# Patient Record
Sex: Female | Born: 1950
Health system: Southern US, Community
[De-identification: ages and names within clinical notes are randomized; demographics above are authoritative.]

## PROBLEM LIST (undated history)

## (undated) DIAGNOSIS — K219 Gastro-esophageal reflux disease without esophagitis: Secondary | ICD-10-CM

## (undated) DIAGNOSIS — F32A Depression, unspecified: Secondary | ICD-10-CM

## (undated) DIAGNOSIS — M797 Fibromyalgia: Secondary | ICD-10-CM

## (undated) DIAGNOSIS — F329 Major depressive disorder, single episode, unspecified: Secondary | ICD-10-CM

## (undated) DIAGNOSIS — S2239XA Fracture of one rib, unspecified side, initial encounter for closed fracture: Secondary | ICD-10-CM

## (undated) DIAGNOSIS — Z5189 Encounter for other specified aftercare: Secondary | ICD-10-CM

## (undated) DIAGNOSIS — S2249XA Multiple fractures of ribs, unspecified side, initial encounter for closed fracture: Secondary | ICD-10-CM

## (undated) DIAGNOSIS — T7840XA Allergy, unspecified, initial encounter: Secondary | ICD-10-CM

## (undated) HISTORY — DX: Multiple fractures of ribs, unspecified side, initial encounter for closed fracture: S22.49XA

## (undated) HISTORY — DX: Fracture of one rib, unspecified side, initial encounter for closed fracture: S22.39XA

## (undated) HISTORY — PX: WRIST SURGERY: SHX841

## (undated) HISTORY — DX: Allergy, unspecified, initial encounter: T78.40XA

## (undated) HISTORY — DX: Gastro-esophageal reflux disease without esophagitis: K21.9

## (undated) HISTORY — DX: Fibromyalgia: M79.7

## (undated) HISTORY — DX: Major depressive disorder, single episode, unspecified: F32.9

## (undated) HISTORY — PX: COLONOSCOPY: SHX174

## (undated) HISTORY — DX: Depression, unspecified: F32.A

## (undated) HISTORY — PX: ABDOMINAL HYSTERECTOMY: SHX81

## (undated) HISTORY — DX: Encounter for other specified aftercare: Z51.89

---

## 2005-02-20 LAB — HM COLONOSCOPY

## 2005-05-31 ENCOUNTER — Encounter: Payer: Self-pay | Admitting: Gastroenterology

## 2005-06-05 ENCOUNTER — Encounter: Payer: Self-pay | Admitting: Gastroenterology

## 2006-02-20 LAB — CONVERTED CEMR LAB

## 2006-02-20 LAB — HM PAP SMEAR

## 2008-05-04 ENCOUNTER — Ambulatory Visit: Payer: Self-pay | Admitting: Family Medicine

## 2008-05-04 DIAGNOSIS — R5383 Other fatigue: Secondary | ICD-10-CM

## 2008-05-04 DIAGNOSIS — J309 Allergic rhinitis, unspecified: Secondary | ICD-10-CM | POA: Insufficient documentation

## 2008-05-04 DIAGNOSIS — R5381 Other malaise: Secondary | ICD-10-CM | POA: Insufficient documentation

## 2008-05-07 ENCOUNTER — Ambulatory Visit: Payer: Self-pay | Admitting: Family Medicine

## 2008-05-11 ENCOUNTER — Ambulatory Visit: Payer: Self-pay | Admitting: Family Medicine

## 2008-05-11 LAB — CONVERTED CEMR LAB
ALT: 22 units/L (ref 0–35)
AST: 26 units/L (ref 0–37)
Albumin: 3.9 g/dL (ref 3.5–5.2)
BUN: 12 mg/dL (ref 6–23)
Basophils Absolute: 0 10*3/uL (ref 0.0–0.1)
Chloride: 105 meq/L (ref 96–112)
Cholesterol: 186 mg/dL (ref 0–200)
Eosinophils Relative: 2.2 % (ref 0.0–5.0)
Glucose, Bld: 92 mg/dL (ref 70–99)
MCV: 93.9 fL (ref 78.0–100.0)
Monocytes Absolute: 0.3 10*3/uL (ref 0.1–1.0)
Neutrophils Relative %: 33.5 % — ABNORMAL LOW (ref 43.0–77.0)
Platelets: 221 10*3/uL (ref 150.0–400.0)
Potassium: 4.3 meq/L (ref 3.5–5.1)
RDW: 11.6 % (ref 11.5–14.6)
TSH: 3.86 microintl units/mL (ref 0.35–5.50)
Total Bilirubin: 0.6 mg/dL (ref 0.3–1.2)
Vitamin B-12: 516 pg/mL (ref 211–911)
WBC: 4.3 10*3/uL — ABNORMAL LOW (ref 4.5–10.5)

## 2008-06-04 ENCOUNTER — Encounter: Payer: Self-pay | Admitting: Family Medicine

## 2008-06-04 ENCOUNTER — Ambulatory Visit: Payer: Self-pay | Admitting: Family Medicine

## 2008-06-10 ENCOUNTER — Encounter (INDEPENDENT_AMBULATORY_CARE_PROVIDER_SITE_OTHER): Payer: Self-pay | Admitting: *Deleted

## 2008-12-29 ENCOUNTER — Ambulatory Visit: Payer: Self-pay | Admitting: Family Medicine

## 2009-04-16 ENCOUNTER — Ambulatory Visit: Payer: Self-pay | Admitting: Family Medicine

## 2009-04-16 DIAGNOSIS — R0789 Other chest pain: Secondary | ICD-10-CM | POA: Insufficient documentation

## 2009-04-28 ENCOUNTER — Ambulatory Visit: Payer: Self-pay | Admitting: Family Medicine

## 2009-04-28 LAB — CONVERTED CEMR LAB
Casts: 0 /lpf
Glucose, Urine, Semiquant: NEGATIVE
Nitrite: NEGATIVE
Urine crystals, microscopic: 0 /hpf
Urobilinogen, UA: 0.2
Yeast, UA: 0

## 2009-05-18 ENCOUNTER — Ambulatory Visit: Payer: Self-pay | Admitting: Family Medicine

## 2009-05-18 DIAGNOSIS — E7800 Pure hypercholesterolemia, unspecified: Secondary | ICD-10-CM | POA: Insufficient documentation

## 2009-05-20 LAB — CONVERTED CEMR LAB
ALT: 18 units/L (ref 0–35)
AST: 22 units/L (ref 0–37)
Albumin: 3.8 g/dL (ref 3.5–5.2)
Alkaline Phosphatase: 65 units/L (ref 39–117)
BUN: 17 mg/dL (ref 6–23)
Basophils Absolute: 0.1 10*3/uL (ref 0.0–0.1)
CO2: 33 meq/L — ABNORMAL HIGH (ref 19–32)
Chloride: 106 meq/L (ref 96–112)
Cholesterol: 184 mg/dL (ref 0–200)
Eosinophils Relative: 2.3 % (ref 0.0–5.0)
Glucose, Bld: 94 mg/dL (ref 70–99)
HCT: 37.8 % (ref 36.0–46.0)
Lymphs Abs: 1.5 10*3/uL (ref 0.7–4.0)
MCV: 93.1 fL (ref 78.0–100.0)
Monocytes Absolute: 0.3 10*3/uL (ref 0.1–1.0)
Monocytes Relative: 7.9 % (ref 3.0–12.0)
Neutrophils Relative %: 52.6 % (ref 43.0–77.0)
Platelets: 230 10*3/uL (ref 150.0–400.0)
Potassium: 4.4 meq/L (ref 3.5–5.1)
RDW: 11.5 % (ref 11.5–14.6)
Sodium: 145 meq/L (ref 135–145)
TSH: 2.72 microintl units/mL (ref 0.35–5.50)
Vitamin B-12: 341 pg/mL (ref 211–911)
WBC: 4.2 10*3/uL — ABNORMAL LOW (ref 4.5–10.5)

## 2009-05-25 ENCOUNTER — Ambulatory Visit: Payer: Self-pay | Admitting: Family Medicine

## 2009-06-02 ENCOUNTER — Ambulatory Visit: Payer: Self-pay | Admitting: Family Medicine

## 2009-06-02 ENCOUNTER — Encounter: Payer: Self-pay | Admitting: Family Medicine

## 2009-06-08 ENCOUNTER — Encounter: Payer: Self-pay | Admitting: Family Medicine

## 2009-06-08 ENCOUNTER — Ambulatory Visit: Payer: Self-pay | Admitting: Family Medicine

## 2009-06-09 DIAGNOSIS — M810 Age-related osteoporosis without current pathological fracture: Secondary | ICD-10-CM | POA: Insufficient documentation

## 2009-06-10 ENCOUNTER — Encounter (INDEPENDENT_AMBULATORY_CARE_PROVIDER_SITE_OTHER): Payer: Self-pay | Admitting: *Deleted

## 2009-07-09 ENCOUNTER — Ambulatory Visit: Payer: Self-pay | Admitting: Family Medicine

## 2009-07-09 DIAGNOSIS — M25559 Pain in unspecified hip: Secondary | ICD-10-CM | POA: Insufficient documentation

## 2009-07-09 DIAGNOSIS — R21 Rash and other nonspecific skin eruption: Secondary | ICD-10-CM | POA: Insufficient documentation

## 2009-07-12 ENCOUNTER — Telehealth: Payer: Self-pay | Admitting: Family Medicine

## 2009-11-23 ENCOUNTER — Ambulatory Visit: Payer: Self-pay | Admitting: Family Medicine

## 2009-11-23 DIAGNOSIS — F325 Major depressive disorder, single episode, in full remission: Secondary | ICD-10-CM | POA: Insufficient documentation

## 2009-12-10 ENCOUNTER — Telehealth: Payer: Self-pay | Admitting: Family Medicine

## 2009-12-10 DIAGNOSIS — K219 Gastro-esophageal reflux disease without esophagitis: Secondary | ICD-10-CM | POA: Insufficient documentation

## 2009-12-14 ENCOUNTER — Encounter (INDEPENDENT_AMBULATORY_CARE_PROVIDER_SITE_OTHER): Payer: Self-pay | Admitting: *Deleted

## 2009-12-14 ENCOUNTER — Telehealth: Payer: Self-pay | Admitting: Gastroenterology

## 2009-12-29 ENCOUNTER — Ambulatory Visit: Payer: Self-pay | Admitting: Family Medicine

## 2010-02-08 ENCOUNTER — Ambulatory Visit: Payer: Self-pay | Admitting: Gastroenterology

## 2010-02-22 ENCOUNTER — Encounter (INDEPENDENT_AMBULATORY_CARE_PROVIDER_SITE_OTHER): Payer: Self-pay | Admitting: *Deleted

## 2010-03-17 ENCOUNTER — Ambulatory Visit
Admission: RE | Admit: 2010-03-17 | Discharge: 2010-03-17 | Payer: Self-pay | Source: Home / Self Care | Attending: Family Medicine | Admitting: Family Medicine

## 2010-03-17 DIAGNOSIS — M549 Dorsalgia, unspecified: Secondary | ICD-10-CM | POA: Insufficient documentation

## 2010-03-21 ENCOUNTER — Encounter (INDEPENDENT_AMBULATORY_CARE_PROVIDER_SITE_OTHER): Payer: Self-pay

## 2010-03-22 ENCOUNTER — Ambulatory Visit
Admission: RE | Admit: 2010-03-22 | Discharge: 2010-03-22 | Payer: Self-pay | Source: Home / Self Care | Attending: Gastroenterology | Admitting: Gastroenterology

## 2010-03-22 NOTE — Assessment & Plan Note (Signed)
Summary: cpx per md/dlo   Vital Signs:  Patient profile:   60 year old female Height:      64 inches Weight:      172.4 pounds BMI:     29.70 Temp:     97.6 degrees F oral Pulse rate:   76 / minute Pulse rhythm:   regular BP sitting:   112 / 72  (left arm) Cuff size:   regular  Vitals Entered By: Benny Lennert CMA Duncan Dull) (May 25, 2009 8:42 AM)  History of Present Illness: Chief complaint CPX per md  Continues to have central chest pain...feels daily.Marland Kitchenlasts hours, mild, rarely takes her breath away.  Occurs sometimes when drinking cold beverage. Had sip of  peppermint tea and had sudden onset of chest pain..same feeling.  No exertional component. Had been doing a lot of lifting with helping son move. Gas in upper abdomen...resolved now.  Gets worse at night when lying down. Has some pain in chest wihen signing.  Stopped caffeinated beverages. Had occured with citris, but stopped. Has been taking prilosec 40 mg  daily...minimal improvement.  Allergies...dry eyes.  Using visine allergy relief drops several times a week...helps   Exercising regularly..3 times a week.   Problems Prior to Update: 1)  Pure Hypercholesterolemia  (ICD-272.0) 2)  Chest Pain, Atypical  (ICD-786.59) 3)  Uti  (ICD-599.0) 4)  Streptococcal Pharyngitis  (ICD-034.0) 5)  Routine Gynecological Examination  (ICD-V72.31) 6)  Well Woman  (ICD-V70.0) 7)  Other Screening Mammogram  (ICD-V76.12) 8)  Screening For Lipoid Disorders  (ICD-V77.91) 9)  ? of Prolapse of Vaginal Vault After Hysterectomy  (ICD-618.5) 10)  Fatigue  (ICD-780.79) 11)  Allergic Rhinitis  (ICD-477.9)  Current Medications (verified): 1)  Calcium 600/vitamin D 600-400 Mg-Unit Chew (Calcium Carbonate-Vitamin D) .Marland Kitchen.. 1 Tab By Mouth Two Times A Day 2)  Nasonex 50 Mcg/act Susp (Mometasone Furoate) .... 2 Sprays Per Nostril Daily 3)  Fexofenadine Hcl 180 Mg Tabs (Fexofenadine Hcl) .Marland Kitchen.. 1 Tab By Mouth  At Bedtime  As Needed 4)   Amitriptyline Hcl 10 Mg Tabs (Amitriptyline Hcl) .Marland Kitchen.. 1 Tab By Mouth By Mouth At Bedtime As Needed 5)  Clor Tab .... Otc As Directed. 6)  Tylenol 325 Mg Tabs (Acetaminophen) .... Otc As Directed. 7)  Vitamin D (Ergocalciferol) 50000 Unit Caps (Ergocalciferol) .Marland Kitchen.. 1 Tab By Mouth Weekly X 12 Weeks 8)  Nexium 40 Mg Cpdr (Esomeprazole Magnesium) .... Take 1 Tablet By Mouth Once A Day  Allergies: 1)  ! Sulfa  Past History:  Past medical, surgical, family and social histories (including risk factors) reviewed, and no changes noted (except as noted below).  Past Medical History: Reviewed history from 05/04/2008 and no changes required. Allergic rhinitis  Past Surgical History: Reviewed history from 05/04/2008 and no changes required. 1984 hysterectomy, partial, both ovaries remain  Family History: Reviewed history from 04/16/2009 and no changes required. father: CVA late age mother: arthritis, breast cancer, kidney failure unknown cause, ? liver issues, DM 3 brothers and 3 ssters: healthy  Social History: Reviewed history from 05/04/2008 and no changes required. Regular exercise-yes 3 times a week Occupation: Runner, broadcasting/film/video  Married Never Smoked Alcohol use-no Drug use-no  Review of Systems General:  Denies fatigue and fever. CV:  Complains of chest pain or discomfort. Resp:  Denies shortness of breath. GI:  Denies constipation and diarrhea. GU:  Denies dysuria and hematuria.  Physical Exam  General:  Well-developed,well-nourished,in no acute distress; alert,appropriate and cooperative throughout examination Eyes:  No corneal or  conjunctival inflammation noted. EOMI. Perrla. Funduscopic exam benign, without hemorrhages, exudates or papilledema. Vision grossly normal. Ears:  External ear exam shows no significant lesions or deformities.  Otoscopic examination reveals clear canals, tympanic membranes are intact bilaterally without bulging, retraction, inflammation or discharge.  Hearing is grossly normal bilaterally. Nose:  nasal dischargemucosal pallor.   Mouth:  Oral mucosa and oropharynx without lesions or exudates.  Teeth in good repair. Neck:  no carotid bruit or thyromegaly no cervical or supraclavicular lymphadenopathy  Chest Wall:  No deformities, masses, or tenderness noted. No evidence of costochondirits associated pain.  Breasts:  No mass, nodules, thickening, tenderness, bulging, retraction, inflamation, nipple discharge or skin changes noted.   Lungs:  Normal respiratory effort, chest expands symmetrically. Lungs are clear to auscultation, no crackles or wheezes. Heart:  Normal rate and regular rhythm. S1 and S2 normal without gallop, murmur, click, rub or other extra sounds. Abdomen:  Bowel sounds positive,abdomen soft and non-tender without masses, organomegaly or hernias noted. Genitalia:  normal introitus, no vaginal discharge, mucosa pink and moist, and no adnexal masses or tenderness.   NO pap Msk:  No deformity or scoliosis noted of thoracic or lumbar spine.   Pulses:  R and L posterior tibial pulses are full and equal bilaterally  Extremities:  no edema Skin:  Intact without suspicious lesions or rashes Psych:  Cognition and judgment appear intact. Alert and cooperative with normal attention span and concentration. No apparent delusions, illusions, hallucinations   Impression & Recommendations:  Problem # 1:  WELL WOMAN (ICD-V70.0) The patient's preventative maintenance and recommended screening tests for an annual wellness exam were reviewed in full today. Brought up to date unless services declined.  Counselled on the importance of diet, exercise, and its role in overall health and mortality. The patient's FH and SH was reviewed, including their home life, tobacco status, and drug and alcohol status.     Problem # 2:  ROUTINE GYNECOLOGICAL EXAMINATION (ICD-V72.31) DVE nml. S/P partial hysterectomy. No PAP.   Problem # 3:  CHEST PAIN,  ATYPICAL (ICD-786.59) Most consistent with GERD, ? esophageal spasm.  No ttp to palpation.  EKG nml 03/2009 Will change PPI to Nexium , conoitnue diet changes. If not improving refer to GI for endo eval.   Complete Medication List: 1)  Calcium 600/vitamin D 600-400 Mg-unit Chew (Calcium carbonate-vitamin d) .Marland Kitchen.. 1 tab by mouth two times a day 2)  Nasonex 50 Mcg/act Susp (Mometasone furoate) .... 2 sprays per nostril daily 3)  Fexofenadine Hcl 180 Mg Tabs (Fexofenadine hcl) .Marland Kitchen.. 1 tab by mouth  at bedtime  as needed 4)  Amitriptyline Hcl 10 Mg Tabs (Amitriptyline hcl) .Marland Kitchen.. 1 tab by mouth by mouth at bedtime as needed 5)  Clor Tab  .... Otc as directed. 6)  Tylenol 325 Mg Tabs (Acetaminophen) .... Otc as directed. 7)  Vitamin D (ergocalciferol) 50000 Unit Caps (Ergocalciferol) .Marland Kitchen.. 1 tab by mouth weekly x 12 weeks 8)  Nexium 40 Mg Cpdr (Esomeprazole magnesium) .... Take 1 tablet by mouth once a day  Other Orders: Radiology Referral (Radiology) Radiology Referral (Radiology)  Patient Instructions: 1)  Start nexium daily. 2)  Call  if chest symptoms do not resolve in 2-3 weeks.  3)  Referral Appointment Information 4)  Day/Date: 5)  Time: 6)  Place/MD: 7)  Address: 8)  Phone/Fax: 9)  Patient given appointment information. Information/Orders faxed/mailed.  Prescriptions: NEXIUM 40 MG CPDR (ESOMEPRAZOLE MAGNESIUM) Take 1 tablet by mouth once a day  #30 x  0   Entered and Authorized by:   Kerby Nora MD   Signed by:   Kerby Nora MD on 05/25/2009   Method used:   Electronically to        [facility] [address] [phone]* (retail)       [address] [place]  [address]       Ph: [phone]       Fax: [phone]   RxID:   [id number]   Current Allergies (reviewed today): ! SULFA    Past Medical History:    Reviewed history from 05/04/2008 and no changes required:       Allergic rhinitis  Past Surgical History:    Reviewed history from 05/04/2008 and no changes  required:       1984 hysterectomy, partial, both ovaries remain

## 2010-03-22 NOTE — Letter (Signed)
Summary: Results Follow up Letter  [place] [facility]  [address] [place], [place] [address]   Phone: [phone]  Fax: [phone]    06/10/2009 MRN: [id number]     Angela Chapman [address] [place], [place]  [address]    Dear Ms. Metheney,  The following are the results of your recent test(s):  Test         Result    Pap Smear:        Normal _____  Not Normal _____ Comments: ______________________________________________________ Cholesterol: LDL(Bad cholesterol):         Your goal is less than:         HDL (Good cholesterol):       Your goal is more than: Comments:  ______________________________________________________ Mammogram:        Normal __x___  Not Normal _____ Comments:Repeat in 1 year  ___________________________________________________________________ Hemoccult:        Normal _____  Not normal _______ Comments:    _____________________________________________________________________ Other Tests:    We routinely do not discuss normal results over the telephone.  If you desire a copy of the results, or you have any questions about this information we can discuss them at your next office visit.   Sincerely,  Kerby Nora MD

## 2010-03-22 NOTE — Assessment & Plan Note (Signed)
Summary: ? UTI   Vital Signs:  Patient profile:   60 year old female Height:      64 inches Weight:      170.75 pounds BMI:     29.42 Temp:     97.7 degrees F oral Pulse rate:   76 / minute Pulse rhythm:   regular BP sitting:   136 / 70  (left arm) Cuff size:   regular  Vitals Entered By: Lewanda Rife LPN (April 29, 5282 2:10 PM)  History of Present Illness: has never had uti before  feels like she has to urinate all the time -- and has to go again when she gets up can feel bladder spasms  is burning when she does go  constant pain just above bladder   no fever no n/v  no blood in urine  Allergies: 1)  ! Sulfa  Past History:  Past Medical History: Last updated: 05/04/2008 Allergic rhinitis  Past Surgical History: Last updated: 05/04/2008 1984 hysterectomy, partial, both ovaries remain  Family History: Last updated: 05/04/2008 father: CVA late age mother: arthritis, breast cancer, kidney failure unknown cause,? 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 CV:  Denies chest pain or discomfort and palpitations. Resp:  Denies cough. GI:  Denies nausea. GU:  Complains of dysuria and urinary frequency; denies hematuria. MS:  Complains of low back pain. Derm:  Denies poor wound healing and rash.  Physical Exam  General:  Well-developed,well-nourished,in no acute distress; alert,appropriate and cooperative throughout examination Head:  normocephalic, atraumatic, and no abnormalities observed.   Neck:  No deformities, masses, or tenderness noted. Lungs:  Normal respiratory effort, chest expands symmetrically. Lungs are clear to  auscultation, no crackles or wheezes. Heart:  Normal rate and regular rhythm. S1 and S2 normal without gallop, murmur, click, rub or other extra sounds. Abdomen:  mild suprapubic tenderness without rebound or gaurding  soft, normal bowel sounds, no distention, no masses, no hepatomegaly, and no splenomegaly.   Msk:  no CVA tenderness  Skin:  Intact without suspicious lesions or rashes Cervical Nodes:  No lymphadenopathy noted Inguinal Nodes:  No significant adenopathy Psych:  normal affect, talkative and pleasant    Impression & Recommendations:  Problem # 1:  UTI (ICD-599.0) Assessment New  uncomplicated with dysuria and frequency  will tx with cipro and update  disc ways to prev utis -- and disc inc water intake  pt advised to update me if symptoms worsen or do not improve  Her updated medication list for this problem includes:    Cipro 250 Mg Tabs (Ciprofloxacin hcl) .Marland Kitchen... 1 by mouth two times a day for 5 days for urinary tract infection  Orders: Prescription Created Electronically [id number])  Complete Medication List: 1)  Calcium 600/vitamin D 600-400 Mg-unit Chew (Calcium carbonate-vitamin d) .Marland Kitchen.. 1 tab by mouth two times a day 2)  Nasonex 50 Mcg/act Susp (Mometasone furoate) .... 2 sprays per nostril daily 3)  Fexofenadine Hcl 180 Mg Tabs (Fexofenadine hcl) .Marland Kitchen.. 1 tab by mouth  at bedtime  as needed 4)  Amitriptyline Hcl 10 Mg Tabs (Amitriptyline hcl) .Marland Kitchen.. 1 tab by mouth by mouth at bedtime as needed 5)  Clor Tab  .... Otc as  directed. 6)  Tylenol 325 Mg Tabs (Acetaminophen) .... Otc as directed. 7)  Omeprazole 40 Mg Cpdr (Omeprazole) .Marland Kitchen.. 1 tab by mouth daily 8)  Cipro 250 Mg Tabs (Ciprofloxacin hcl) .Marland Kitchen.. 1 by mouth two times a day for 5 days for urinary tract infection  Patient Instructions: 1)  continue drinking lots of water 2)  call or seek care is symptoms don't improve in 2-3 days or if you develop back pain, nausea, or vomiting or fever  3)  take the cipro as  directed  Prescriptions: CIPRO 250 MG TABS (CIPROFLOXACIN HCL) 1 by mouth two times a day for 5 days for urinary tract infection  #10 x 0   Entered and Authorized by:   Judith Part MD   Signed by:   Judith Part MD on 04/28/2009   Method used:   Electronically to        [facility]. [address] [phone]* (retail)       [address] [place], [place]  [address]       Ph: [phone]       Fax: [phone]   RxID:   [id number]   Current Allergies (reviewed today): ! SULFA  Laboratory Results   Urine Tests  Date/Time Received: April 28, 2009 2:15 PM  Date/Time Reported: April 28, 2009 2:15 PM   Routine Urinalysis   Appearance: Hazy Glucose: negative   (Normal Range: Negative) Bilirubin: negative   (Normal Range: Negative) Ketone: negative   (Normal Range: Negative) Spec. Gravity: 1.015   (Normal Range: 1.003-1.035) Blood: trace-intact   (Normal Range: Negative) pH: 5.0   (Normal Range: 5.0-8.0) Protein: trace   (Normal Range: Negative) Urobilinogen: 0.2   (Normal Range: 0-1) Nitrite: negative   (Normal Range: Negative) Leukocyte Esterace: trace   (Normal Range: Negative)  Urine Microscopic WBC/HPF: 3-4 RBC/HPF: 1-2 Bacteria/HPF: mod Mucous/HPF: few Epithelial/HPF: 1-3 Crystals/HPF: 0 Casts/LPF: 0 Yeast/HPF: 0 Other: 0

## 2010-03-22 NOTE — Progress Notes (Signed)
Summary: pt wants endoscopy  Phone Note Call from Patient Call back at [phone]   Caller: Patient Call For: Kerby Nora MD Summary of Call: Pt states she is ready to have endoscopy.  She wants to go to Dr. Christella Hartigan, who did her husband's endoscopy. Initial call taken by: Lowella Petties CMA,  December 10, 2009 2:48 PM  Follow-up for Phone Call        patient advised via message on machine.Consuello Masse CMA   Follow-up by: Benny Lennert CMA Duncan Dull),  December 13, 2009 8:00 AM  New Problems: GERD (ICD-530.81)   New Problems: GERD (ICD-530.81)

## 2010-03-22 NOTE — Letter (Signed)
Summary: New Patient letter  [facility]  [address] [place], [place] [address]   Phone: [phone]  Fax: [phone]       12/14/2009 MRN: [id number]  Angela Chapman [address] [place], [place]  [address]  Dear Ms. Oblinger,  Welcome to the Gastroenterology Division at [facility].    You are scheduled to see Dr.  Christella Hartigan  on 12/31/09  at 2:45 pm on the 3rd floor at [facility] [address], [address]  We ask that you try to arrive at our office 15 minutes prior to your appointment time to allow for check-in.  We would like you to complete the enclosed self-administered evaluation form prior to your visit and bring it with you on the day of your appointment.  We will review it with you.  Also, please bring a complete list of all your medications or, if you prefer, bring the medication bottles and we will list them.  Please bring your insurance card so that we may make a copy of it.  If your insurance requires a referral to see a specialist, please bring your referral form from your primary care physician.  Co-payments are due at the time of your visit and may be paid by cash, check or credit card.     Your office visit will consist of a consult with your physician (includes a physical exam), any laboratory testing he/she may order, scheduling of any necessary diagnostic testing (e.g. x-ray, ultrasound, CT-scan), and scheduling of a procedure (e.g. Endoscopy, Colonoscopy) if required.  Please allow enough time on your schedule to allow for any/all of these possibilities.    If you cannot keep your appointment, please call [phone] to cancel or reschedule prior to your appointment date.  This allows [place] the opportunity to schedule an appointment for another patient in need of care.  If you do not cancel or reschedule by 5 p.m. the business day prior to your appointment date, you will be charged a $50.00 late cancellation/no-show fee.    Thank you for choosing  [facility] for your medical needs.  We appreciate the opportunity to care for you.  Please visit [place] at our website  to learn more about our practice.                     Sincerely,                                                             The Gastroenterology Division   Appended Document: New Patient letter letter mailed

## 2010-03-22 NOTE — Assessment & Plan Note (Signed)
Summary: TINGLING DOWN SPINE X 1 MTH/CLE   Vital Signs:  Patient profile:   60 year old female Height:      64 inches Weight:      181.0 pounds BMI:     31.18 Temp:     98.0 degrees F oral Pulse rate:   64 / minute Pulse rhythm:   regular BP sitting:   130 / 64  (left arm) Cuff size:   large  Vitals Entered By: Benny Lennert CMA Duncan Dull) (November 23, 2009 12:28 PM)  History of Present Illness: Chief complaint tingling down spine for 1 month  Under a lot of stress...since mother passed away a year ago.  She feels she is on upside of being depressed...but still far from where it needs to be.   GERD, improved with exercise and avoiding acid foods. Has stopped caffeine, no OJ. Stopped nexium, but didn't help much. Has gained some weight...7 lb weight gain.    Feeling "startburst" of heat in spine,intermittant x several months. Notes most often after exercsie. No numbness, no weakness in legs. No pain in back. No rash.  MGM had RA. No fall.  Had been on amitryptiline in past for fibromyalgia...stopped taking years ago. Took one the other night to help her rest.  Problems Prior to Update: 1)  Hip Pain, Bilateral  (ICD-719.45) 2)  Rash and Other Nonspecific Skin Eruption  (ICD-782.1) 3)  Other Osteoporosis  (ICD-733.09) 4)  Special Screening For Osteoporosis  (ICD-V82.81) 5)  Pure Hypercholesterolemia  (ICD-272.0) 6)  Chest Pain, Atypical  (ICD-786.59) 7)  Routine Gynecological Examination  (ICD-V72.31) 8)  Well Woman  (ICD-V70.0) 9)  Other Screening Mammogram  (ICD-V76.12) 10)  ? of Prolapse of Vaginal Vault After Hysterectomy  (ICD-618.5) 11)  Fatigue  (ICD-780.79) 12)  Allergic Rhinitis  (ICD-477.9)  Current Medications (verified): 1)  Calcium 600/vitamin D 600-400 Mg-Unit Chew (Calcium Carbonate-Vitamin D) .Marland Kitchen.. 1 Tab By Mouth Two Times A Day 2)  Nasonex 50 Mcg/act Susp (Mometasone Furoate) .... 2 Sprays Per Nostril Daily 3)  Fexofenadine Hcl 180 Mg Tabs  (Fexofenadine Hcl) .Marland Kitchen.. 1 Tab By Mouth  At Bedtime  As Needed 4)  Amitriptyline Hcl 10 Mg Tabs (Amitriptyline Hcl) .Marland Kitchen.. 1 Tab By Mouth By Mouth At Bedtime As Needed 5)  Clor Tab .... Otc As Directed. 6)  Tylenol 325 Mg Tabs (Acetaminophen) .... Otc As Directed. 7)  Vitamin D (Ergocalciferol) 50000 Unit Caps (Ergocalciferol) .Marland Kitchen.. 1 Tab By Mouth Weekly X 12 Weeks 8)  Nexium 40 Mg Cpdr (Esomeprazole Magnesium) .... Take 1 Tablet By Mouth Once A Day 9)  Triamcinolone Acetonide 0.5 % Crea (Triamcinolone Acetonide) .... Apply To Affected Area Two Times A Day  Allergies: 1)  ! Sulfa  Past History:  Past medical, surgical, family and social histories (including risk factors) reviewed, and no changes noted (except as noted below).  Past Medical History: Reviewed history from 05/04/2008 and no changes required. Allergic rhinitis  Past Surgical History: Reviewed history from 05/04/2008 and no changes required. 1984 hysterectomy, partial, both ovaries remain  Family History: Reviewed history from 04/16/2009 and no changes required. father: CVA late age mother: arthritis, breast cancer, kidney failure unknown cause, ? liver issues, DM 3 brothers and 3 ssters: healthy  Social History: Reviewed history from 05/04/2008 and no changes required. Regular exercise-yes 3 times a week Occupation: Runner, broadcasting/film/video  Married Never Smoked Alcohol use-no Drug use-no  Review of Systems General:  Complains of fatigue; denies fever. CV:  Denies swelling of feet.  Resp:  Denies shortness of breath. GI:  Denies abdominal pain and bloody stools. GU:  Denies dysuria.  Physical Exam  General:  mildly anxious appearing female in NAD Mouth:  Oral mucosa and oropharynx without lesions or exudates.  Teeth in good repair. Neck:  no carotid bruit or thyromegaly no cervical or supraclavicular lymphadenopathy  Lungs:  Normal respiratory effort, chest expands symmetrically. Lungs are clear to auscultation, no crackles  or wheezes. Heart:  Normal rate and regular rhythm. S1 and S2 normal without gallop, murmur, click, rub or other extra sounds. Abdomen:  Bowel sounds positive,abdomen soft and non-tender without masses, organomegaly or hernias noted. Msk:  No deformity or scoliosis noted of thoracic or lumbar spine.   Pulses:  R and L posterior tibial pulses are full and equal bilaterally  Extremities:  no edema  Neurologic:  No cranial nerve deficits noted. Station and gait are normal. Plantar reflexes are down-going bilaterally. DTRs are symmetrical throughout. Sensory, motor and coordinative functions appear intact. Skin:  Intact without suspicious lesions or rashes Psych:  Oriented X3, memory intact for recent and remote, normally interactive, good eye contact, and slightly anxious.     Impression & Recommendations:  Problem # 1:  CHEST PAIN, ATYPICAL (ICD-786.59) Improved with treatment of GERD. HAs stopped PPI.Marland Kitchenif symptoms return despite low acid diet, call.   Problem # 2:  ANXIETY DEPRESSION (ICD-300.4) No clear structural cause of current symtpoms..no clear back abnormality and not consistent with specific nerve distribution. Given severe stress and elements of depression/anxiety and poor control of fibromyalgia...will restart amitryptiline. Coincidentally this medication can help with neurologic source pain. Follow up if not improving for further evaluation.  I   Complete Medication List: 1)  Calcium 600/vitamin D 600-400 Mg-unit Chew (Calcium carbonate-vitamin d) .Marland Kitchen.. 1 tab by mouth two times a day 2)  Nasonex 50 Mcg/act Susp (Mometasone furoate) .... 2 sprays per nostril daily 3)  Fexofenadine Hcl 180 Mg Tabs (Fexofenadine hcl) .Marland Kitchen.. 1 tab by mouth  at bedtime  as needed 4)  Amitriptyline Hcl 10 Mg Tabs (Amitriptyline hcl) .Marland Kitchen.. 1 tab by mouth by mouth at bedtime 5)  Clor Tab  .... Otc as directed. 6)  Tylenol 325 Mg Tabs (Acetaminophen) .... Otc as directed. 7)  Nexium 40 Mg Cpdr (Esomeprazole  magnesium) .... Take 1 tablet by mouth once a day 8)  Triamcinolone Acetonide 0.5 % Crea (Triamcinolone acetonide) .... Apply to affected area two times a day  Patient Instructions: 1)  Keep up with great job on exercise. Get back on track with weight loss. 2)   Call if reflux symptoms return even when on low acid diet. 3)  Restart amitryptiline 10 mg at bedtime. 4)  Please schedule a follow-up appointment in 1 month 30 min.  Prescriptions: AMITRIPTYLINE HCL 10 MG TABS (AMITRIPTYLINE HCL) 1 tab by mouth by mouth at bedtime  #30 x 3   Entered and Authorized by:   Kerby Nora MD   Signed by:   Kerby Nora MD on 11/23/2009   Method used:   Faxed to ...       [facility] (retail)       [address]       [place], [place]    [place]       Ph: [phone]       Fax: [phone]   RxID:   [id number]   Current Allergies (reviewed today): ! SULFA  Last Flu Vaccine:  given (11/20/2008 2:47:33 PM) Flu Vaccine Result Date:  11/17/2009 Flu Vaccine Result:  given Flu Vaccine Next Due:  1 yr

## 2010-03-22 NOTE — Progress Notes (Signed)
Summary: appt   Phone Note From Other Clinic Call back at [phone]   Caller: Shirlee Limerick, scheduler Call For: Dr. Christella Hartigan (by request) Reason for Call: Schedule Patient Appt Summary of Call: Dr. Ermalene Searing would like pt seen for chest pain and gerd Initial call taken by: Vallarie Mare,  December 14, 2009 9:23 AM  Follow-up for Phone Call        appt scheduled with Dr Christella Hartigan 12/31/09.  New packet mailed out Shirlee Limerick will also notify pt Follow-up by: Chales Abrahams CMA Duncan Dull),  December 14, 2009 9:35 AM

## 2010-03-22 NOTE — Letter (Signed)
Summary: Results Follow up Letter  [facility]  [address] [place], [place] [address]   Phone: [phone]  Fax: [phone]    06/10/2009 MRN: [id number]     Angela Chapman [address] [place], [place]  [address]    Dear [name],  The following are the results of your recent test(s):  Test         Result    Pap Smear:        Normal _____  Not Normal _____ Comments: ______________________________________________________ Cholesterol: LDL(Bad cholesterol):         Your goal is less than:         HDL (Good cholesterol):       Your goal is more than: Comments:  ______________________________________________________ Mammogram:        Normal _____  Not Normal _____ Comments:  ___________________________________________________________________ Hemoccult:        Normal _____  Not normal _______ Comments:    _____________________________________________________________________ Other Tests:Bone Density: Notify pt that she has evidence of osteoporosis in hip. Make appt to discuss and to test vit D if needed.   We routinely do not discuss normal results over the telephone.  If you desire a copy of the results, or you have any questions about this information we can discuss them at your next office visit.   Sincerely,  Kerby Nora MD

## 2010-03-22 NOTE — Assessment & Plan Note (Signed)
Summary: PRESSURE IN CHEST & RUN DOWN / LFW   Vital Signs:  Patient profile:   60 year old female Height:      64 inches Weight:      172.0 pounds BMI:     29.63 Temp:     98.2 degrees F oral Pulse rate:   80 / minute Pulse rhythm:   regular BP sitting:   140 / 70  (left arm) Cuff size:   regular  Vitals Entered By: Benny Lennert CMA Duncan Dull) (April 16, 2009 2:36 PM)  History of Present Illness: Chief complaint pressure in chest and run down  Chest discomfort in chest. Pressure constant in chest, although occ intermittant. No exertional component. Occ worse with citris fruit etc. Had been doing a lot of lifting with helping son move. Gas in upper abdomen.  Gets worse at night when lying down. Notes that after eating fruit or juice she has more vaginal yeast/skin yeast. Stopped caffeine..no relief.  Pepcid has not helped.  Did have GI flu like episode and fatigue in January as well. No resolved..BMs normal. Still some conitnued fatigue.  Feels that fibromyalgia doing well.   Problems Prior to Update: 1)  Uti  (ICD-599.0) 2)  Streptococcal Pharyngitis  (ICD-034.0) 3)  Routine Gynecological Examination  (ICD-V72.31) 4)  Well Woman  (ICD-V70.0) 5)  Other Screening Mammogram  (ICD-V76.12) 6)  Screening For Lipoid Disorders  (ICD-V77.91) 7)  ? of Prolapse of Vaginal Vault After Hysterectomy  (ICD-618.5) 8)  Fatigue  (ICD-780.79) 9)  Allergic Rhinitis  (ICD-477.9)  Current Medications (verified): 1)  Calcium 600/vitamin D 600-400 Mg-Unit Chew (Calcium Carbonate-Vitamin D) .Marland Kitchen.. 1 Tab By Mouth Two Times A Day 2)  Nasonex 50 Mcg/act Susp (Mometasone Furoate) .... 2 Sprays Per Nostril Daily 3)  Fexofenadine Hcl 180 Mg Tabs (Fexofenadine Hcl) .Marland Kitchen.. 1 Tab By Mouth  At Bedtime  As Needed 4)  Amitriptyline Hcl 10 Mg Tabs (Amitriptyline Hcl) .Marland Kitchen.. 1 Tab By Mouth By Mouth At Bedtime As Needed 5)  Clor Tab .... Otc As Directed. 6)  Tylenol 325 Mg Tabs (Acetaminophen) .... Otc As  Directed. 7)  Omeprazole 40 Mg Cpdr (Omeprazole) .Marland Kitchen.. 1 Tab By Mouth Daily  Allergies: 1)  ! Sulfa  Past History:  Past medical, surgical, family and social histories (including risk factors) reviewed, and no changes noted (except as noted below).  Past Medical History: Reviewed history from 05/04/2008 and no changes required. Allergic rhinitis  Past Surgical History: Reviewed history from 05/04/2008 and no changes required. 1984 hysterectomy, partial, both ovaries remain  Family History: Reviewed history from 05/04/2008 and no changes required. father: CVA late age mother: arthritis, breast cancer, kidney failure unknown cause, ? liver issues, DM 3 brothers and 3 ssters: healthy  Social History: Reviewed history from 05/04/2008 and no changes required. 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 CV:  Denies palpitations. Resp:  Denies cough and shortness of breath. GI:  Denies abdominal pain, bloody stools, and constipation. GU:  Denies dysuria.  Physical Exam  General:  Well-developed,well-nourished,in no acute distress; alert,appropriate and cooperative throughout examination Mouth:  Oral mucosa and oropharynx without lesions or exudates.  Teeth in good repair. Neck:  no carotid bruit or thyromegaly, no cervical or supraclavicular lymphadenopathy  Chest Wall:  ttp over anterior chest wall, B Breasts:  No mass, nodules, thickening, tenderness, bulging, retraction, inflamation, nipple discharge or skin changes noted.  Lungs:  Normal respiratory effort, chest expands symmetrically. Lungs are clear to auscultation, no crackles or wheezes. Heart:  Normal rate and regular rhythm. S1 and S2 normal without gallop, murmur, click, rub or other extra sounds. Abdomen:  mld epigastric ttp, NABS, no rebound , no guarding, no hepatomegaly and no splenomegaly.     Pulses:  R and L posterior tibial pulses are full and equal bilaterally  Skin:  Intact without suspicious lesions or rashes   Impression & Recommendations:  Problem # 1:  CHEST PAIN, ATYPICAL (ICD-786.59) Most consistent with GERD and cehst wall muscle soreness.  Trreat with trigger avoidance (info given and foods reviewed) and Prilosec 40 mg daily. Gentle chest wall stretches recommended, heat as needed. Avoid NSAIDs to avoid stomach irritation.   Complete Medication List: 1)  Calcium 600/vitamin D 600-400 Mg-unit Chew (Calcium carbonate-vitamin d) .Marland Kitchen.. 1 tab by mouth two times a day 2)  Nasonex 50 Mcg/act Susp (Mometasone furoate) .... 2 sprays per nostril daily 3)  Fexofenadine Hcl 180 Mg Tabs (Fexofenadine hcl) .Marland Kitchen.. 1 tab by mouth  at bedtime  as needed 4)  Amitriptyline Hcl 10 Mg Tabs (Amitriptyline hcl) .Marland Kitchen.. 1 tab by mouth by mouth at bedtime as needed 5)  Clor Tab  .... Otc as directed. 6)  Tylenol 325 Mg Tabs (Acetaminophen) .... Otc as directed. 7)  Omeprazole 40 Mg Cpdr (Omeprazole) .Marland Kitchen.. 1 tab by mouth daily 8)  Cipro 250 Mg Tabs (Ciprofloxacin hcl) .Marland Kitchen.. 1 by mouth two times a day for 5 days for urinary tract infection   Patient Instructions: 1)  Start omeprazole daily. (prilosec OTC 2 tab daily ) 2)  Make appt for CPX after 05/11/2009. 3)  With fasting labs prior CMET, lipids Dx 272.0, TSH, cbc, vit D B12 Dx 780.79 Prescriptions: NASONEX 50 MCG/ACT SUSP (MOMETASONE FUROATE) 2 sprays per nostril daily  #1 x 11   Entered and Authorized by:   Kerby Nora MD   Signed by:   Kerby Nora MD on 04/16/2009   Method used:   Electronically to        [facility] [address] [phone]* (retail)       [address] [place]  [address]       Ph: [phone]       Fax: [phone]   RxID:   [id number] OMEPRAZOLE 40 MG CPDR (OMEPRAZOLE) 1 tab by mouth daily  #30 x 3   Entered and Authorized by:   Kerby Nora MD   Signed by:   Kerby Nora MD on 04/16/2009   Method  used:   Electronically to        [facility] [address] [phone]* (retail)       [address] [place], [place]  [address]       Ph: [phone]       Fax: [phone]   RxID:   [id number] FEXOFENADINE HCL 180 MG TABS (FEXOFENADINE HCL) 1 tab by mouth  at bedtime  as needed  #90 x 3   Entered and Authorized by:   Kerby Nora MD   Signed by:   Kerby Nora MD on 04/16/2009   Method used:   Electronically to        [facility] [address] [phone]* (retail)       [address] [place]  [address]       Ph: [phone]  Fax: [phone]   RxID:   [id number]   Current Allergies (reviewed today): ! SULFA

## 2010-03-22 NOTE — Assessment & Plan Note (Signed)
Summary: 30 min per md 1 m f/u dlo   Vital Signs:  Patient profile:   60 year old female Height:      64 inches Weight:      180.0 pounds BMI:     31.01 Temp:     98.5 degrees F oral Pulse rate:   72 / minute Pulse rhythm:   regular BP sitting:   150 / 80  (left arm) Cuff size:   large  Vitals Entered By: Benny Lennert CMA Duncan Dull) (December 29, 2009 12:07 PM)  History of Present Illness: Chief complaint 1 month follow up  Anxiety depression: Restartred amitryptiline at bedtime 1 month ago.  Feels significant imrpvement in mood. Has started working out more.   Has lost 1 lb in last month.   Continue unusual back pain. No change with amitryptiline. Feeling "startburst" of heat in spine,intermittant x several months. LAsts 30 seconds Now feels anytime.  OCcuring 1-2 times per day. No numbness, no weakness in legs. No pain in back. No rash.  MGM had RA. No fall.  GERD...had worsening when stopped nexium.Marland Kitchenimprved back on this med. Has scheduled appt with Dr. Larae Grooms   Problems Prior to Update: 1)  Gerd  (ICD-530.81) 2)  Anxiety Depression  (ICD-300.4) 3)  Hip Pain, Bilateral  (ICD-719.45) 4)  Rash and Other Nonspecific Skin Eruption  (ICD-782.1) 5)  Other Osteoporosis  (ICD-733.09) 6)  Special Screening For Osteoporosis  (ICD-V82.81) 7)  Pure Hypercholesterolemia  (ICD-272.0) 8)  Chest Pain, Atypical  (ICD-786.59) 9)  Routine Gynecological Examination  (ICD-V72.31) 10)  Well Woman  (ICD-V70.0) 11)  Other Screening Mammogram  (ICD-V76.12) 12)  ? of Prolapse of Vaginal Vault After Hysterectomy  (ICD-618.5) 13)  Fatigue  (ICD-780.79) 14)  Allergic Rhinitis  (ICD-477.9)  Current Medications (verified): 1)  Calcium 600/vitamin D 600-400 Mg-Unit Chew (Calcium Carbonate-Vitamin D) .Marland Kitchen.. 1 Tab By Mouth Two Times A Day 2)  Nasonex 50 Mcg/act Susp (Mometasone Furoate) .... 2 Sprays Per Nostril Daily 3)  Fexofenadine Hcl 180 Mg Tabs (Fexofenadine Hcl) .Marland Kitchen.. 1 Tab By Mouth  At  Bedtime  As Needed 4)  Amitriptyline Hcl 25 Mg Tabs (Amitriptyline Hcl) .Marland Kitchen.. 1 Tab By Mouthpo At Bedtime 5)  Clor Tab .... Otc As Directed. 6)  Tylenol 325 Mg Tabs (Acetaminophen) .... Otc As Directed. 7)  Nexium 40 Mg Cpdr (Esomeprazole Magnesium) .... Take 1 Tablet By Mouth Once A Day 8)  Triamcinolone Acetonide 0.5 % Crea (Triamcinolone Acetonide) .... Apply To Affected Area Two Times A Day  Allergies: 1)  ! Sulfa  Past History:  Past medical, surgical, family and social histories (including risk factors) reviewed, and no changes noted (except as noted below).  Past Medical History: Reviewed history from 05/04/2008 and no changes required. Allergic rhinitis  Past Surgical History: Reviewed history from 05/04/2008 and no changes required. 1984 hysterectomy, partial, both ovaries remain  Family History: Reviewed history from 04/16/2009 and no changes required. father: CVA late age mother: arthritis, breast cancer, kidney failure unknown cause, ? liver issues, DM 3 brothers and 3 ssters: healthy  Social History: Reviewed history from 05/04/2008 and no changes required. 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 CV:  Denies chest pain or discomfort. Resp:  Denies shortness of breath.  Physical Exam  General:  mildly anxious appearing female in NAD Mouth:  Oral mucosa and oropharynx without lesions or exudates.  Teeth  in good repair. Neck:  no carotid bruit or thyromegaly no cervical or supraclavicular lymphadenopathy  Lungs:  Normal respiratory effort, chest expands symmetrically. Lungs are clear to auscultation, no crackles or wheezes. Heart:  Normal rate and regular rhythm. S1 and S2 normal without gallop, murmur, click, rub or other extra sounds. Msk:  No deformity or scoliosis noted of thoracic or lumbar spine.   Neurologic:  No cranial nerve deficits  noted. Station and gait are normal. Plantar reflexes are down-going bilaterally. DTRs are symmetrical throughout. Sensory, motor and coordinative functions appear intact. Psych:  Oriented X3, memory intact for recent and remote, normally interactive, good eye contact, and slightly anxious.     Impression & Recommendations:  Problem # 1:  ANXIETY DEPRESSION (ICD-300.4) Improved wtih restarting amitryptiline..recommend increasing to 50 mg at bedtime as unusual "back heat/burst" ongoing.  No clear neurologic origin, nml neuro exam, nml back exam. Follow up in 3 months for further eval.   Complete Medication List: 1)  Calcium 600/vitamin D 600-400 Mg-unit Chew (Calcium carbonate-vitamin d) .Marland Kitchen.. 1 tab by mouth two times a day 2)  Nasonex 50 Mcg/act Susp (Mometasone furoate) .... 2 sprays per nostril daily 3)  Fexofenadine Hcl 180 Mg Tabs (Fexofenadine hcl) .Marland Kitchen.. 1 tab by mouth  at bedtime  as needed 4)  Amitriptyline Hcl 25 Mg Tabs (Amitriptyline hcl) .Marland Kitchen.. 1 tab by mouthpo at bedtime 5)  Clor Tab  .... Otc as directed. 6)  Tylenol 325 Mg Tabs (Acetaminophen) .... Otc as directed. 7)  Nexium 40 Mg Cpdr (Esomeprazole magnesium) .... Take 1 tablet by mouth once a day 8)  Triamcinolone Acetonide 0.5 % Crea (Triamcinolone acetonide) .... Apply to affected area two times a day  Patient Instructions: 1)  Please schedule a follow-up appointment in 3 months  30 min. 2)  Call sooner if symptoms worsening.  Prescriptions: AMITRIPTYLINE HCL 25 MG TABS (AMITRIPTYLINE HCL) 1 tab by mouthpo at bedtime  #30 x 3   Entered and Authorized by:   Kerby Nora MD   Signed by:   Kerby Nora MD on 12/29/2009   Method used:   Print then Give to Patient   RxID:   [id number]    Orders Added: 1)  Est. Patient Level III [14782]    Current Allergies (reviewed today): ! SULFA

## 2010-03-22 NOTE — Progress Notes (Signed)
Summary: Triamcinolone Cream  Phone Note Refill Request Message from:  Fax from Pharmacy on Jul 12, 2009 3:02 PM  Refills Requested: Medication #1:  TRIAMCINOLONE ACETONIDE 0.5 % CREA Apply to affected Area two times a day. [facility], [facility]  Phone:   [phone]   Method Requested: Electronic Initial call taken by: Delilah Shan CMA (AAMA),  Jul 12, 2009 3:02 PM    Prescriptions: TRIAMCINOLONE ACETONIDE 0.5 % CREA (TRIAMCINOLONE ACETONIDE) Apply to affected Area two times a day  # 60 gm x 1   Entered and Authorized by:   Kerby Nora MD   Signed by:   Kerby Nora MD on 07/13/2009   Method used:   Electronically to        [facility] [address] [phone]* (retail)       [address], [place]  [address]       Ph: [phone]       Fax: [phone]   RxID:   [id number]

## 2010-03-22 NOTE — Assessment & Plan Note (Signed)
Summary: NEXIUM NOT HELPING/RASH/RBH   Vital Signs:  Patient profile:   60 year old female Weight:      174 pounds Temp:     98 degrees F oral Pulse rate:   64 / minute Pulse rhythm:   regular BP sitting:   120 / 72  (right arm) Cuff size:   large  Vitals Entered By: Lowella Petties CMA (Jul 09, 2009 3:41 PM) CC: Rash over body x 7 days, taking nexium x one month but not helping- pt thinks nexium gave her the rash.   History of Present Illness: Continues to have central chest pain Occurs sometimes when drinking cold beverage. Had sip of  peppermint tea and had sudden onset of chest pain..same feeling.  No exertional component. Gas in upper abdomen...resolved now.  Gets worse at night when lying down. Has some pain in chest when singing.  Stopped caffeinated beverages. Had occured with citris, but stopped. Has been taking prilosec 40 mg  daily...minimal improvement. Now s/p nexium x 2 months..continued to have pain intermittantly...but none in last 2-3 weeks until today.Marland Kitchenafter drinking lemonade..sharp pain in chest.  She feels aciphex helped more in past. ..but not covered by insurance Stopped nexium 4 days ago .  EKG nml in 03/2009  B hip pain...intermittant in last month. After sititng still. No numbness, no weakness in legs.  No back pain.   Recent DXA with osteoporosis...  Note red bumps on chest and back in past 7 days. Was in hotel recently..different soap. No bites.     Problems Prior to Update: 1)  Other Osteoporosis  (ICD-733.09) 2)  Special Screening For Osteoporosis  (ICD-V82.81) 3)  Pure Hypercholesterolemia  (ICD-272.0) 4)  Chest Pain, Atypical  (ICD-786.59) 5)  Routine Gynecological Examination  (ICD-V72.31) 6)  Well Woman  (ICD-V70.0) 7)  Other Screening Mammogram  (ICD-V76.12) 8)  ? of Prolapse of Vaginal Vault After Hysterectomy  (ICD-618.5) 9)  Fatigue  (ICD-780.79) 10)  Allergic Rhinitis  (ICD-477.9)  Current Medications (verified): 1)   Calcium 600/vitamin D 600-400 Mg-Unit Chew (Calcium Carbonate-Vitamin D) .Marland Kitchen.. 1 Tab By Mouth Two Times A Day 2)  Nasonex 50 Mcg/act Susp (Mometasone Furoate) .... 2 Sprays Per Nostril Daily 3)  Fexofenadine Hcl 180 Mg Tabs (Fexofenadine Hcl) .Marland Kitchen.. 1 Tab By Mouth  At Bedtime  As Needed 4)  Amitriptyline Hcl 10 Mg Tabs (Amitriptyline Hcl) .Marland Kitchen.. 1 Tab By Mouth By Mouth At Bedtime As Needed 5)  Clor Tab .... Otc As Directed. 6)  Tylenol 325 Mg Tabs (Acetaminophen) .... Otc As Directed. 7)  Vitamin D (Ergocalciferol) 50000 Unit Caps (Ergocalciferol) .Marland Kitchen.. 1 Tab By Mouth Weekly X 12 Weeks 8)  Nexium 40 Mg Cpdr (Esomeprazole Magnesium) .... Take 1 Tablet By Mouth Once A Day 9)  Triamcinolone Acetonide 0.5 % Crea (Triamcinolone Acetonide) .... Apply To Affected Area Two Times A Day  Allergies (verified): 1)  ! Sulfa  Past History:  Past medical, surgical, family and social histories (including risk factors) reviewed, and no changes noted (except as noted below).  Past Medical History: Reviewed history from 05/04/2008 and no changes required. Allergic rhinitis  Past Surgical History: Reviewed history from 05/04/2008 and no changes required. 1984 hysterectomy, partial, both ovaries remain  Family History: Reviewed history from 04/16/2009 and no changes required. father: CVA late age mother: arthritis, breast cancer, kidney failure unknown cause, ? liver issues, DM 3 brothers and 3 ssters: healthy  Social History: Reviewed history from 05/04/2008 and no changes required. Regular exercise-yes 3 times  a week Occupation: Runner, broadcasting/film/video  Married Never Smoked Alcohol use-no Drug use-no  Review of Systems General:  Denies fatigue. CV:  Denies chest pain or discomfort. Resp:  Denies shortness of breath. GI:  Complains of indigestion.  Physical Exam  General:  Well-developed,well-nourished,in no acute distress; alert,appropriate and cooperative throughout examination Mouth:  Oral mucosa and  oropharynx without lesions or exudates.  Teeth in good repair. Neck:  no carotid bruit or thyromegaly no cervical or supraclavicular lymphadenopathy  Lungs:  Normal respiratory effort, chest expands symmetrically. Lungs are clear to auscultation, no crackles or wheezes. Heart:  Normal rate and regular rhythm. S1 and S2 normal without gallop, murmur, click, rub or other extra sounds. Abdomen:  Bowel sounds positive,abdomen soft and non-tender without masses, organomegaly or hernias noted. Msk:  ttp B trochanters, neg Faber, full ROM B hips, no buttock pain Pulses:  R and L posterior tibial pulses are full and equal bilaterally  Extremities:  no edema  Neurologic:  No cranial nerve deficits noted. Station and gait are normal. Sensory, motor and coordinative functions appear intact. Skin:  erythematous papules on ant cehst and baupper back where sun hits.  Resolving sunburn on low back.    Impression & Recommendations:  Problem # 1:  CHEST PAIN, ATYPICAL (ICD-786.59) Improved when compared to last OV...although pt is unsure. Restart nexium..rash does not look like med rash. Avoid foods that trigger symptoms.   Problem # 2:  OTHER OSTEOPOROSIS (ICD-733.09) Refuses bisphosphonate. Check vit D after recent supplementation.  Her updated medication list for this problem includes:    Calcium 600/vitamin D 600-400 Mg-unit Chew (Calcium carbonate-vitamin d) .Marland Kitchen... 1 tab by mouth two times a day    Vitamin D (ergocalciferol) 50000 Unit Caps (Ergocalciferol) .Marland Kitchen... 1 tab by mouth weekly x 12 weeks  Orders: T-Vitamin D (25-Hydroxy) ([id number])  Problem # 3:  HIP PAIN, BILATERAL (ICD-719.45) LAteral pain..most consistent with trochanteric bursitis. Treat with tylenol (no NSAIDs given current GERD) and ROM exercises.  Consider steroid injection if not improving.  Her updated medication list for this problem includes:    Tylenol 325 Mg Tabs (Acetaminophen) ..... Otc as directed.  Problem # 4:  RASH  AND OTHER NONSPECIFIC SKIN ERUPTION (ICD-782.1) Most consistent with photosensitvity from sun given location. Also possible reactipon to soap at hotel etc. Treat with 2 weeks of steroid cream.  Her updated medication list for this problem includes:    Triamcinolone Acetonide 0.5 % Crea (Triamcinolone acetonide) .Marland Kitchen... Apply to affected area two times a day  Complete Medication List: 1)  Calcium 600/vitamin D 600-400 Mg-unit Chew (Calcium carbonate-vitamin d) .Marland Kitchen.. 1 tab by mouth two times a day 2)  Nasonex 50 Mcg/act Susp (Mometasone furoate) .... 2 sprays per nostril daily 3)  Fexofenadine Hcl 180 Mg Tabs (Fexofenadine hcl) .Marland Kitchen.. 1 tab by mouth  at bedtime  as needed 4)  Amitriptyline Hcl 10 Mg Tabs (Amitriptyline hcl) .Marland Kitchen.. 1 tab by mouth by mouth at bedtime as needed 5)  Clor Tab  .... Otc as directed. 6)  Tylenol 325 Mg Tabs (Acetaminophen) .... Otc as directed. 7)  Vitamin D (ergocalciferol) 50000 Unit Caps (Ergocalciferol) .Marland Kitchen.. 1 tab by mouth weekly x 12 weeks 8)  Nexium 40 Mg Cpdr (Esomeprazole magnesium) .... Take 1 tablet by mouth once a day 9)  Triamcinolone Acetonide 0.5 % Crea (Triamcinolone acetonide) .... Apply to affected area two times a day  Patient Instructions: 1)  Start calcium and vit D two times a day  2)  Increase  weight bearing exercise. 3)  Continue nexium. Start steroid cream for rash. 4)  Call if interested in starting alendronate (generic fosamax) 5)  Tylenol as needed... start stretching exercsies in hips. 6)  Please schedule a follow-up appointment in 3 months 30 min appt. .  Prescriptions: TRIAMCINOLONE ACETONIDE 0.5 % CREA (TRIAMCINOLONE ACETONIDE) Apply to affected Area two times a day  # 60 gm x 1   Entered and Authorized by:   Kerby Nora MD   Signed by:   Kerby Nora MD on 07/09/2009   Method used:   Electronically to        [facility] [address] [phone]* (retail)       [address] [place], [place]  [address]       Ph: [phone]       Fax:  [phone]   RxID:   [id number] TRIAMCINOLONE ACETONIDE 0.5 % CREA (TRIAMCINOLONE ACETONIDE) Apply to affected Area two times a day  # 60 gm x 1   Entered and Authorized by:   Kerby Nora MD   Signed by:   Kerby Nora MD on 07/09/2009   Method used:   Print then Give to Patient   RxID:   [id number]   Prior Medications (reviewed today): CALCIUM 600/VITAMIN D 600-400 MG-UNIT CHEW (CALCIUM CARBONATE-VITAMIN D) 1 tab by mouth two times a day NASONEX 50 MCG/ACT SUSP (MOMETASONE FUROATE) 2 sprays per nostril daily FEXOFENADINE HCL 180 MG TABS (FEXOFENADINE HCL) 1 tab by mouth  at bedtime  as needed AMITRIPTYLINE HCL 10 MG TABS (AMITRIPTYLINE HCL) 1 tab by mouth by mouth at bedtime as needed CLOR TAB () OTC As directed. TYLENOL 325 MG TABS (ACETAMINOPHEN) OTC As directed. VITAMIN D (ERGOCALCIFEROL) 50000 UNIT CAPS (ERGOCALCIFEROL) 1 tab by mouth weekly x 12 weeks Current Allergies (reviewed today): ! SULFA  Appended Document: NEXIUM NOT HELPING/RASH/RBH

## 2010-03-24 NOTE — Assessment & Plan Note (Signed)
History of Present Illness Visit Type: Initial Consult Primary GI MD: Rob Bunting MD Primary Provider: Sherril Cong Requesting Provider: Sherril Cong Chief Complaint: Chest pain and GERD History of Present Illness:     very pleasant 1 60-year-old woman  in october she was having pain when swallowing.  She was on nexium as needed, but changed to daily dosing and that sensation improved.  takes it 30-60 min before supper.   Still certain foods will "bother her" with swalloing pains.    She has had dysphagia at times.  She believes she has a hiatal hernia.  had EGD and colonoscopy in [place].  She thinks polyps were found, but not sure what was found on EGD.   not on bisphosphonates, not on any antibiotics recently.   overall stable weight in past year.  No nausea or overt GI bleeding.             Current Medications (verified): 1)  Calcium 600/vitamin D 600-400 Mg-Unit Chew (Calcium Carbonate-Vitamin D) .Marland Kitchen.. 1 Tab By Mouth Two Times A Day 2)  Nasonex 50 Mcg/act Susp (Mometasone Furoate) .... 2 Sprays Per Nostril Daily 3)  Fexofenadine Hcl 180 Mg Tabs (Fexofenadine Hcl) .Marland Kitchen.. 1 Tab By Mouth  At Bedtime  As Needed 4)  Amitriptyline Hcl 25 Mg Tabs (Amitriptyline Hcl) .Marland Kitchen.. 1 Tab By Mouthpo At Bedtime 5)  Clor Tab .... Otc As Directed. 6)  Tylenol 325 Mg Tabs (Acetaminophen) .... Otc As Directed. 7)  Nexium 40 Mg Cpdr (Esomeprazole Magnesium) .... Take 1 Tablet By Mouth Once A Day 8)  Triamcinolone Acetonide 0.5 % Crea (Triamcinolone Acetonide) .... Apply To Affected Area Two Times A Day  Allergies (verified): 1)  ! Sulfa  Past History:  Past Medical History: Allergic rhinitis  Fibromyalgia Depression  Past Surgical History: 1984 hysterectomy, partial, both ovaries remain    Family History: father: CVA late age mother: arthritis, breast cancer, kidney failure unknown cause, ? 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  All other review of systems was otherwise negative.   Vital Signs:  Patient profile:   60 year old female Height:      64 inches Weight:      180 pounds BMI:     31.01 BSA:     1.87 Pulse rate:   66 / minute Pulse rhythm:   regular BP sitting:   100 / 80  (left arm)  Vitals Entered By: Merri Ray CMA Duncan Dull) (February 08, 2010 2:07 PM)  Physical Exam  Additional Exam:  Constitutional: generally well appearing Psychiatric: alert and oriented times 3 Eyes: extraocular movements intact Mouth: oropharynx moist, no lesions Neck: supple, no lymphadenopathy Cardiovascular: heart regular rate and rythm Lungs: CTA bilaterally Abdomen: soft, non-tender, non-distended, no obvious ascites, no peritoneal signs, normal bowel sounds Extremities: no lower extremity edema bilaterally Skin: no lesions on visible extremities    Impression & Recommendations:  Problem # 1:  History of polyps we will get records from her recent colonoscopy in [place] and put her on appropriate surveillance recall.  Problem # 2:  odynophagia, dysphagia this may be acid related. Perhaps infectious. We will proceed with EGD at her soonest convenience in  the meantime she will stay on proton pump inhibitor once daily.  Patient Instructions: 1)  We will get colonoscopy and EGD reports from [place]. 2)  You will be scheduled to have an upper endoscopy. 3)  A copy of this information will be sent to Dr. Ermalene Searing. 4)  The medication list was reviewed and reconciled.  All changed / newly prescribed medications were explained.  A complete medication list was provided to the patient / caregiver.  Appended Document:  reviewed outside records: Colonoscopy for  "screening" 05/2005 to the cecum without limitations: "2 four mm polyps" in ascending colon.  was recommended to have recall colonoscopy in 3 years if adenomas, 5 years if non-adenomas. Pathology showed hyperplastic polyps only.  These rec's are not consistent with current polyp surveillance guidelines.    EGD 05/2005 was normal except for small HH, indications "GERD"  patty, she needs recall colonoscopy in 05/2015 (call her to let her know I reviewed the [place] procedures, pathology).  otherwise, Should continue with the suggestions outlined at recent visit.  Appended Document: recall colon recall in IDX and EMR and EGD scheduled as well as previsit   Clinical Lists Changes  Observations: Added new observation of COLONNXTDUE: 05/2015 (02/22/2010 8:45)

## 2010-03-24 NOTE — Letter (Signed)
Summary: Previsit letter  [facility]  [address] [place], [place] [address]   Phone: [phone]  Fax: [phone]       02/22/2010 MRN: [id number]  Angela Chapman [address] [place], [place]  [address]  Dear [name],  Welcome to the Gastroenterology Division at [facility].    You are scheduled to see a nurse for your pre-procedure visit on 03/22/10 at 1:30 pm on the 3rd floor at [facility], [address]  We ask that you try to arrive at our office 15 minutes prior to your appointment time to allow for check-in.  Your nurse visit will consist of discussing your medical and surgical history, your immediate family medical history, and your medications.    Please bring a complete list of all your medications or, if you prefer, bring the medication bottles and we will list them.  We will need to be aware of both prescribed and over the counter drugs.  We will need to know exact dosage information as well.  If you are on blood thinners (Coumadin, Plavix, Aggrenox, Ticlid, etc.) please call our office today/prior to your appointment, as we need to consult with your physician about holding your medication.   Please be prepared to read and sign documents such as consent forms, a financial agreement, and acknowledgement forms.  If necessary, and with your consent, a friend or relative is welcome to sit-in on the nurse visit with you.  Please bring your insurance card so that we may make a copy of it.  If your insurance requires a referral to see a specialist, please bring your referral form from your primary care physician.  No co-pay is required for this nurse visit.     If you cannot keep your appointment, please call [phone] to cancel or reschedule prior to your appointment date.  This allows [place] the opportunity to schedule an appointment for another patient in need of care.    Thank you for choosing [facility] for your medical  needs.  We appreciate the opportunity to care for you.  Please visit [place] at our website  to learn more about our practice.                     Sincerely.                                                                                                                   The Gastroenterology Division

## 2010-03-24 NOTE — Procedures (Signed)
Summary: EGD / [facility]  EGD / [facility]   Imported By: Lennie Odor 02/25/2010 12:07:07  _____________________________________________________________________  External Attachment:    Type:   Image     Comment:   External Document

## 2010-03-24 NOTE — Assessment & Plan Note (Signed)
Summary: BACK PAIN/CLE   Vital Signs:  Patient profile:   60 year old female Height:      64 inches Weight:      181.25 pounds BMI:     31.22 Temp:     97.8 degrees F oral Pulse rate:   84 / minute Pulse rhythm:   regular BP sitting:   142 / 80  (left arm) Cuff size:   regular  Vitals Entered By: Delilah Shan CMA Shey Yott Dull) (March 17, 2010 10:28 AM) CC: Back pain   History of Present Illness: Back pain.  Started in L lower paraspinal area on Monday.  No known trigger; no falls, MVA, etc.  Burning  sensation in the area.  She had an old rx for flexeril, initially with some relief.  Trouble sleeping due to pain and "I can't get comfortable".  "It feels swollen."  No radiation into the leg.  No pain on R side.  No FCNAV.   No dysuria.  No rash on the lower back.   Allergies: 1)  ! Sulfa 2)  ! 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  Otherwise negative.    Physical Exam  General:  no apparent distress normocephalic atraumatic mucous membranes moist regular rate and rhythm clear to auscultation bilaterally no acute rash on lower back back w/o midline pain.  L lumbar paraspinal area minimally tender to palpation, worse with lateral flexion to L side.  No pain with flex/ext at the waist. abdominal exam benign, w/o tenderness SLR neg bilaterally, dtrs wnl distally and w/o weakness in legs.    Impression & Recommendations:  Problem # 1:  BACK PAIN (ICD-724.5) Likely muscle spasm and concurrent nerve root irritation.  She is some better with local heat.  I would use mobic with GI caution and flexeril along with heat.  Fu as needed.  If GI symptoms worse on mobic, then stop the med and notify the clinic.  She agrees.  Her updated medication list for this problem includes:    Tylenol 325 Mg Tabs (Acetaminophen) ..... Otc as  directed.    Flexeril 10 Mg Tabs (Cyclobenzaprine hcl) .Marland Kitchen... 1/2 to 1 tab by mouth three times a day as needed for pain, sedation caution (hold amitriptyline while on flexeril)    Mobic 7.5 Mg Tabs (Meloxicam) .Marland Kitchen... 1 by mouth once daily for pain. eat before taking med  Orders: Prescription Created Electronically [phone])  Complete Medication List: 1)  Calcium 600/vitamin D 600-400 Mg-unit Chew (Calcium carbonate-vitamin d) .Marland Kitchen.. 1 tab by mouth two times a day 2)  Nasonex 50 Mcg/act Susp (Mometasone furoate) .... 2 sprays per nostril daily 3)  Fexofenadine Hcl 180 Mg Tabs (Fexofenadine hcl) .Marland Kitchen.. 1 tab by mouth  at bedtime  as needed 4)  Amitriptyline Hcl 25 Mg Tabs (Amitriptyline hcl) .Marland Kitchen.. 1 tab by mouthpo at bedtime 5)  Clor Tab  .... Otc as directed. 6)  Tylenol 325 Mg Tabs (Acetaminophen) .... Otc as directed. 7)  Nexium 40 Mg Cpdr (Esomeprazole magnesium) .... Take 1 tablet by mouth once a day 8)  Triamcinolone Acetonide 0.5 % Crea (Triamcinolone acetonide) .... Apply to affected area two times a day 9)  Flexeril 10 Mg Tabs (Cyclobenzaprine hcl) .... 1/2 to 1 tab by mouth three times a day as needed for pain, sedation caution (hold amitriptyline while  on flexeril) 10)  Mobic 7.5 Mg Tabs (Meloxicam) .Marland Kitchen.. 1 by mouth once daily for pain. eat before taking med  Patient Instructions: 1)  Take the flexeril three times a day as needed.  Don't take it with the amitriptyline.  Eat before you take the mobic in the AM.  Gradually work back to exercising.  Let us know if you aren't improving.  Take care.  Prescriptions: FLEXERIL 10 MG TABS (CYCLOBENZAPRINE HCL) 1/2 to 1 tab by mouth three times a day as needed for pain, sedation caution (hold amitriptyline while on flexeril)  #30 x 1   Entered and Authorized by:   Crawford Givens MD   Signed by:   Crawford Givens MD on 03/17/2010   Method used:   Faxed to ...       [facility] (retail)       [address]       [place], [place]    [place]        Ph: [phone]       Fax: [phone]   RxID:   [id number] MOBIC 7.5 MG TABS (MELOXICAM) 1 by mouth once daily for pain. Eat before taking med  #30 x 1   Entered and Authorized by:   Crawford Givens MD   Signed by:   Crawford Givens MD on 03/17/2010   Method used:   Faxed to ...       [facility] (retail)       [address]       [place], [place]    [place]       Ph: [phone]       Fax: [phone]   RxID:   [id number] FLEXERIL 10 MG TABS (CYCLOBENZAPRINE HCL) 1/2 to 1 tab by mouth three times a day as needed for pain, sedation caution (hold amitriptyline while on flexeril)  #30 x 1   Entered and Authorized by:   Crawford Givens MD   Signed by:   Crawford Givens MD on 03/17/2010   Method used:   Print then Give to Patient   RxID:   [id number]    Orders Added: 1)  Prescription Created Electronically [[id number]] 2)  Est. Patient Level III [93235]    Current Allergies (reviewed today): ! SULFA ! PERCOCET

## 2010-03-24 NOTE — Procedures (Signed)
Summary: Colonoscopy / [facility]  Colonoscopy / [facility]   Imported By: Lennie Odor 02/25/2010 12:02:23  _____________________________________________________________________  External Attachment:    Type:   Image     Comment:   External Document

## 2010-03-29 ENCOUNTER — Encounter: Payer: Self-pay | Admitting: Gastroenterology

## 2010-03-29 ENCOUNTER — Other Ambulatory Visit (AMBULATORY_SURGERY_CENTER): Payer: BC Managed Care – PPO | Admitting: Gastroenterology

## 2010-03-29 DIAGNOSIS — K449 Diaphragmatic hernia without obstruction or gangrene: Secondary | ICD-10-CM

## 2010-03-29 DIAGNOSIS — K219 Gastro-esophageal reflux disease without esophagitis: Secondary | ICD-10-CM

## 2010-03-29 DIAGNOSIS — R1310 Dysphagia, unspecified: Secondary | ICD-10-CM

## 2010-03-30 NOTE — Miscellaneous (Signed)
Summary: Lec previsit  Clinical Lists Changes  Observations: Added new observation of ALLERGY REV: Done (03/22/2010 13:18)

## 2010-03-30 NOTE — Letter (Signed)
Summary: EGD Instructions  [facility]  [address] [place], [place] [address]   Phone: [phone]  Fax: [phone]       Angela Chapman    Apr 11, 1950    MRN: [id number]       Procedure Day /Date: Tuesday 03-29-10      Arrival Time:  10:00 am     Procedure Time: 11:00 am     Location of Procedure:                    _ x _ [facility] (4th Floor)    PREPARATION FOR ENDOSCOPY   On Tuesday  03-29-10,  THE DAY OF THE PROCEDURE:  1.   No solid foods, milk or milk products are allowed after midnight the night before your procedure.  2.   Do not drink anything colored red or purple.  Avoid juices with pulp.  No orange juice.  3.  You may drink clear liquids until  9:00 a.m. , which is 2 hours before your procedure.                                                                                                CLEAR LIQUIDS INCLUDE: Water Jello Ice Popsicles Tea (sugar ok, no milk/cream) Powdered fruit flavored drinks Coffee (sugar ok, no milk/cream) Gatorade Juice: apple, white grape, white cranberry  Lemonade Clear bullion, consomm, broth Carbonated beverages (any kind) Strained chicken noodle soup Hard Candy   MEDICATION INSTRUCTIONS  Unless otherwise instructed, you should take regular prescription medications with a small sip of water as early as possible the morning of your procedure.         OTHER INSTRUCTIONS  You will need a responsible adult at least 57 years of age to accompany you and drive you home.   This person must remain in the waiting room during your procedure.  Wear loose fitting clothing that is easily removed.  Leave jewelry and other valuables at home.  However, you may wish to bring a book to read or an iPod/MP3 player to listen to music as you wait for your procedure to start.  Remove all body piercing jewelry and leave at home.  Total time from sign-in until discharge is approximately 2-3 hours.  You  should go home directly after your procedure and rest.  You can resume normal activities the day after your procedure.  The day of your procedure you should not:   Drive   Make legal decisions   Operate machinery   Drink alcohol   Return to work  You will receive specific instructions about eating, activities and medications before you leave.    The above instructions have been reviewed and explained to me by   Ulis Rias RN  March 22, 2010 2:23 PM     I fully understand and can verbalize these instructions _____________________________ Date _________

## 2010-04-04 ENCOUNTER — Ambulatory Visit: Payer: Self-pay | Admitting: Family Medicine

## 2010-04-06 ENCOUNTER — Ambulatory Visit: Payer: Self-pay | Admitting: Family Medicine

## 2010-04-07 NOTE — Procedures (Signed)
Summary: Endoscopy   EGD  Procedure date:  03/29/2010  Findings:      Location: [facility]   ENDOSCOPY PROCEDURE REPORT  PATIENT:  Angela Chapman, Angela Chapman  MR#:  [id number] BIRTHDATE:   08-20-1950, 59 yrs. old   GENDER:   female ENDOSCOPIST:   Rachael Fee, MD Referred by: Excell Seltzer, M.D. PROCEDURE DATE:  03/29/2010 PROCEDURE:  EGD, diagnostic 43235 ASA CLASS:   Class II INDICATIONS: GERD, intermittent dysphagia MEDICATIONS:   Fentanyl 50 mcg IV, Versed 5 mg IV TOPICAL ANESTHETIC:   none  DESCRIPTION OF PROCEDURE:   After the risks benefits and alternatives of the procedure were thoroughly explained, informed consent was obtained.  The LB GIF-H180 [id number] endoscope was introduced through the mouth and advanced to the second portion of the duodenum, without limitations.  The instrument was slowly withdrawn as the mucosa was fully examined. <<PROCEDUREIMAGES>>          <<OLD IMAGES>> A hiatal hernia was found. This was 1-2cm (see image6).  Otherwise the examination was normal (see image3, image4, image5, image2, and image1).    Retroflexed views revealed no abnormalities.    The scope was then withdrawn from the patient and the procedure completed. COMPLICATIONS:   None  ENDOSCOPIC IMPRESSION:  1) Small hiatal hernia  2) Otherwise normal examination  RECOMMENDATIONS:  Continue to chew food well, eat slowly.  Stay on PPI (nexium) one pill 20-30 min before breakfast meal daily.   _______________________________ Rachael Fee, MD

## 2010-06-29 ENCOUNTER — Other Ambulatory Visit: Payer: Self-pay | Admitting: *Deleted

## 2010-06-29 ENCOUNTER — Other Ambulatory Visit: Payer: Self-pay | Admitting: Family Medicine

## 2010-06-30 MED ORDER — MELOXICAM 7.5 MG PO TABS: ORAL_TABLET | ORAL | Status: DC

## 2010-12-14 ENCOUNTER — Ambulatory Visit (INDEPENDENT_AMBULATORY_CARE_PROVIDER_SITE_OTHER): Payer: BC Managed Care – PPO | Admitting: Family Medicine

## 2010-12-14 ENCOUNTER — Encounter: Payer: Self-pay | Admitting: Family Medicine

## 2010-12-14 VITALS — BP 110/72 | HR 78 | Temp 98.7°F | Ht 65.0 in | Wt 174.8 lb

## 2010-12-14 DIAGNOSIS — Z23 Encounter for immunization: Secondary | ICD-10-CM

## 2010-12-14 DIAGNOSIS — R0789 Other chest pain: Secondary | ICD-10-CM

## 2010-12-14 DIAGNOSIS — R079 Chest pain, unspecified: Secondary | ICD-10-CM

## 2010-12-14 DIAGNOSIS — M2550 Pain in unspecified joint: Secondary | ICD-10-CM

## 2010-12-14 MED ORDER — RABEPRAZOLE SODIUM 20 MG PO TBEC: 20.0000 mg | DELAYED_RELEASE_TABLET | Freq: Every day | ORAL | Status: DC

## 2010-12-14 NOTE — Progress Notes (Signed)
  Subjective:    Patient ID: Gevena Chapman, female    DOB: [birth year], 60 y.o.   MRN: [id number]  HPI  Angela Chapman, a 60 y.o. female presents today in the office for the following:    08/15/2010  Having some stiffening of ankles and hands.  Pulled off a small tick.  The patient was also living in [place] at that time. She's been having some intermittent stiffening of her hands, ankles, elbows, shoulders and knees, over the last couple of months. She questions whether or not this is related to prior tick bite.  She also complains of chest pain. He's been having within the last couple of days. It is substernal, does radiate a little bit to the left side of her chest and also around the upper portion of her chest. It is not provoked by activity. She is able to walk up and down the steps and walk around without induction of chest pain. She does have a significant history of GERD, and recently changed from Nexium that was working well to over-the-counter Pepcid.  The PMH, PSH, Social History, Family History, Medications, and allergies have been reviewed in [facility], and have been updated if relevant.   Review of Systems Ros above, no recent fevers, chills, sweats, nausea    Objective:   Physical Exam   Physical Exam  Blood pressure 110/72, pulse 78, temperature 98.7 F (37.1 C), temperature source Oral, height 5\' 5"  (1.651 m), weight 174 lb 12.8 oz (79.289 kg), SpO2 98.00%.  GEN: WDWN, NAD, Non-toxic, A & O x 3 HEENT: Atraumatic, Normocephalic. Neck supple. No masses, No LAD. Ears and Nose: No external deformity. CV: RRR, No M/G/R. No JVD. No thrill. No extra heart sounds. PULM: CTA B, no wheezes, crackles, rhonchi. No retractions. No resp. distress. No accessory muscle use. EXTR: No c/c/e NEURO Normal gait.  PSYCH: Normally interactive. Conversant. Not depressed or anxious appearing.  Calm demeanor.    MSK: Normal range of motion at the shoulders, elbows, right wrist. Limitation of her  range of motion by about 50% on the left wrist, status post prior operative intervention. Normal range of motion at the hips as well as the knees with good motion and no pain throughout the course of motion. Strength is 5/5 throughout. There is no synovitis or tenosynovitis noted.    Assessment & Plan:   1. Polyarthralgia  B. burgdorfi antibodies  2. Flu vaccine need  Flu vaccine greater than or equal to 3yo preservative free IM  3. Chest pain      Given that she lived in [place], it is reasonable to check for Lyme titers.  Chest pain: EKG is very reassuring. Historically it does not sound like cardiac chest pain. He is reasonable to check her reflux medicine and change to what his work for her well in the past.  EKG: Normal sinus rhythm. Normal axis, normal R wave progression, No acute ST elevation or depression.

## 2010-12-15 LAB — B. BURGDORFI ANTIBODIES: B burgdorferi Ab IgG+IgM: 0.12 {ISR}

## 2011-02-03 ENCOUNTER — Other Ambulatory Visit: Payer: Self-pay | Admitting: Family Medicine

## 2011-02-03 ENCOUNTER — Encounter: Payer: Self-pay | Admitting: Family Medicine

## 2011-02-03 ENCOUNTER — Ambulatory Visit (INDEPENDENT_AMBULATORY_CARE_PROVIDER_SITE_OTHER): Payer: BC Managed Care – PPO | Admitting: Family Medicine

## 2011-02-03 DIAGNOSIS — J0100 Acute maxillary sinusitis, unspecified: Secondary | ICD-10-CM

## 2011-02-03 DIAGNOSIS — J019 Acute sinusitis, unspecified: Secondary | ICD-10-CM

## 2011-02-03 MED ORDER — AMOXICILLIN 500 MG PO CAPS: 1000.0000 mg | ORAL_CAPSULE | Freq: Two times a day (BID) | ORAL | Status: AC

## 2011-02-03 NOTE — Progress Notes (Signed)
  Patient Name: Angela Chapman Date of Birth: 1950/03/29 Age: 60 y.o. Medical Record Number: [id number] Gender: female  History of Present Illness:  Angela Chapman is a 60 y.o. very pleasant female patient who presents with the following:  Has had some sinus problems off and on for years. Using Neti pot --- and has been sick now for six weeks. Has been taking some sudafed also. Has been doing Neti pot and nasonex. But not every day.  Facial pain, discharge from nose, thick green. Pain in teeth and behind eyes. Some ear fullness  Somewhat better, but feels like cannot hear all that well. Body feels dried out. Constant itch. Feels dry all over.   Past Medical History, Surgical History, Social History, Family History, and Problem List have been reviewed in EHR and updated if relevant.  Review of Systems: ROS: GEN: Acute illness details above GI: Tolerating PO intake GU: maintaining adequate hydration and urination Pulm: No SOB Interactive and getting along well at home.  Otherwise, ROS is as per the HPI.   Physical Examination: Filed Vitals:   02/03/11 0950  BP: 140/70  Pulse: 72  Temp: 98.5 F (36.9 C)  TempSrc: Oral  Height: 5\' 5"  (1.651 m)  Weight: 174 lb 12.8 oz (79.289 kg)  SpO2: 98%    Body mass index is 29.09 kg/(m^2).   Gen: WDWN, NAD; alert,appropriate and cooperative throughout exam  HEENT: Normocephalic and atraumatic. Throat clear, w/o exudate, no LAD, R TM clear, L TM - good landmarks, No fluid present. rhinnorhea.  Left frontal and maxillary sinuses: Tender Right frontal and maxillary sinuses: Tender  Neck: No ant or post LAD CV: RRR, No M/G/R Pulm: Breathing comfortably in no resp distress. no w/c/r Abd: S,NT,ND,+BS Extr: no c/c/e Psych: full affect, pleasant  Skin: no rash  Assessment and Plan: 1. Acute sinusitis   2. Sinusitis, acute, maxillary     Acute sinusitis: ABX as below.  Refer to the patient instructions sections for details of plan  shared with patient.  Reviewed symptomatic care as well as ABX in this case.   I am not sure about itching -- may just be dry skin. No clear rash on exam. OTC anti-H and lotions ok, occ TAC ok

## 2011-03-25 ENCOUNTER — Other Ambulatory Visit: Payer: Self-pay | Admitting: Family Medicine

## 2011-03-27 MED ORDER — AMOXICILLIN-POT CLAVULANATE 875-125 MG PO TABS: 1.0000 | ORAL_TABLET | Freq: Two times a day (BID) | ORAL | Status: AC

## 2011-03-27 NOTE — Telephone Encounter (Signed)
Not authorized --- find out of still having sinusitis sx, then we should change ABX, not use same

## 2011-03-27 NOTE — Telephone Encounter (Signed)
rx sent to pharmacy and patient advised. 

## 2011-03-27 NOTE — Telephone Encounter (Signed)
Change to augmentin 875 mg po bid, #20  It is amox + something else to make stronger. We need to do since first rx did not work

## 2011-03-27 NOTE — Telephone Encounter (Signed)
Patient says that she is still having symptoms and want to do the amoxicillin b/c she didn't have any side effects to the medication

## 2011-06-19 ENCOUNTER — Ambulatory Visit (INDEPENDENT_AMBULATORY_CARE_PROVIDER_SITE_OTHER): Payer: BC Managed Care – PPO | Admitting: Family Medicine

## 2011-06-19 ENCOUNTER — Encounter: Payer: Self-pay | Admitting: Family Medicine

## 2011-06-19 VITALS — BP 140/88 | HR 80 | Temp 98.9°F | Ht 65.0 in | Wt 176.8 lb

## 2011-06-19 DIAGNOSIS — J020 Streptococcal pharyngitis: Secondary | ICD-10-CM

## 2011-06-19 DIAGNOSIS — J309 Allergic rhinitis, unspecified: Secondary | ICD-10-CM

## 2011-06-19 DIAGNOSIS — J029 Acute pharyngitis, unspecified: Secondary | ICD-10-CM

## 2011-06-19 MED ORDER — AMOXICILLIN 500 MG PO CAPS: 1000.0000 mg | ORAL_CAPSULE | Freq: Two times a day (BID) | ORAL | Status: AC

## 2011-06-19 NOTE — Patient Instructions (Signed)
Vitamin D 2000 units a day

## 2011-06-19 NOTE — Progress Notes (Signed)
  Patient Name: Angela Chapman Date of Birth: 11/28/1950 Age: 61 y.o. Medical Record Number: [id number] Gender: female Date of Encounter: 06/19/2011  History of Present Illness:  Angela Chapman is a 61 y.o. very pleasant female patient who presents with the following:  2 weeks, has been taking some allegra from feb to now. Started to take some chlortabs along with that. Worsened allergies, nasal congestion, ear stuffiness. Not doing nasal steroids - read about some SE that she was concerned about.  ST has worsened in the last week or so.  No cough.  Past Medical History, Surgical History, Social History, Family History, Problem List, Medications, and Allergies have been reviewed and updated if relevant.  Review of Systems: ROS: GEN: Acute illness details above GI: Tolerating PO intake GU: maintaining adequate hydration and urination Pulm: No SOB Interactive and getting along well at home.  Otherwise, ROS is as per the HPI.   Physical Examination: Filed Vitals:   06/19/11 0948  BP: 140/88  Pulse: 80  Temp: 98.9 F (37.2 C)  TempSrc: Oral  Height: 5\' 5"  (1.651 m)  Weight: 176 lb 12.8 oz (80.196 kg)  SpO2: 97%    Body mass index is 29.42 kg/(m^2).   GEN: WDWN, NAD, Non-toxic, A & O x 3 HEENT: Atraumatic, Normocephalic. Neck supple. No masses, No LAD. Ears and Nose: No external deformity. TM normal B. Oropharynx clear, no exudate. CV: RRR, No M/G/R. No JVD. No thrill. No extra heart sounds. PULM: CTA B, no wheezes, crackles, rhonchi. No retractions. No resp. distress. No accessory muscle use. EXTR: No c/c/e NEURO Normal gait.  PSYCH: Normally interactive. Conversant. Not depressed or anxious appearing.  Calm demeanor.    Assessment and Plan:  1. Sore throat  POCT rapid strep A  2. Strep throat    3. Allergic rhinitis     Rapid Strep A Screen  Date Value Range Status  06/19/2011 Positive* Negative (no units) Final   Ar worsened also, reviewed various options, pt  decided intermittent nasonex reasonable  Orders Today: Orders Placed This Encounter  Procedures  . POCT rapid strep A    Medications Today: Meds ordered this encounter  Medications  . amoxicillin (AMOXIL) 500 MG capsule    Sig: Take 2 capsules (1,000 mg total) by mouth 2 (two) times daily.    Dispense:  40 capsule    Refill:  0

## 2011-07-02 ENCOUNTER — Other Ambulatory Visit: Payer: Self-pay | Admitting: Family Medicine

## 2011-08-28 ENCOUNTER — Emergency Department: Payer: Self-pay | Admitting: Emergency Medicine

## 2011-08-28 ENCOUNTER — Telehealth: Payer: Self-pay

## 2011-08-28 NOTE — Telephone Encounter (Signed)
One hour ago pt stung by 6 yellow jackets; 5 stings to left hand and 1 to elbow. In past pt sensitive to yellow jacket stings(kept whelps for 6 months but no trouble breathing). Now pt feels tightness in esophagus or chest with slight difficulty breathing; pt is at home alone. Advised I will call 911 for pt to go to ER for eval. Pt said no she did not want an ambulance and wanted to come to our office. Pt has hx of esophageal spasms and that is what pt thinks she is having now along with excitement of being stung. Dr Patsy Lager said pt needed to be evaluated where could be intubated if needed and ideally should go by ambulance. Pt said she had spoken with friend who lived near by and she will take her to [facility] ER. Esophagus is not feeling as tight but still has pain in esophagus near throat when tries to take deep breath; but breathing is no worse. Pt states hand and feet are itching. Pt got ice pack for hand while waiting on friend; I remained on phone with pt until friend arrived and pt is going to [facility] ER now. If condition worsens on way to ER pts friend will call 911. Pt will call back after eval at ER with update.

## 2011-08-28 NOTE — Telephone Encounter (Signed)
Agree. Urgent eval needed in case of anaphylaxis.

## 2011-10-12 ENCOUNTER — Ambulatory Visit: Payer: Self-pay | Admitting: Family Medicine

## 2011-10-13 ENCOUNTER — Encounter: Payer: Self-pay | Admitting: Family Medicine

## 2011-10-17 ENCOUNTER — Encounter: Payer: Self-pay | Admitting: *Deleted

## 2011-10-17 ENCOUNTER — Encounter: Payer: Self-pay | Admitting: Family Medicine

## 2011-10-20 ENCOUNTER — Telehealth: Payer: Self-pay | Admitting: Family Medicine

## 2011-10-20 DIAGNOSIS — M818 Other osteoporosis without current pathological fracture: Secondary | ICD-10-CM

## 2011-10-20 DIAGNOSIS — E7800 Pure hypercholesterolemia, unspecified: Secondary | ICD-10-CM

## 2011-10-20 DIAGNOSIS — Z130 Encounter for screening for diseases of the blood and blood-forming organs and certain disorders involving the immune mechanism: Secondary | ICD-10-CM

## 2011-10-20 NOTE — Telephone Encounter (Signed)
Message copied by Excell Seltzer on Fri Oct 20, 2011 12:39 PM ------      Message from: Baldomero Lamy      Created: Tue Oct 17, 2011  3:43 PM      Regarding: Cpx labs Tues 9/10       Please order  future cpx labs for pt's upcomming lab appt.      Thanks      Rodney Booze

## 2011-10-24 ENCOUNTER — Other Ambulatory Visit: Payer: Self-pay | Admitting: Family Medicine

## 2011-10-24 DIAGNOSIS — D649 Anemia, unspecified: Secondary | ICD-10-CM

## 2011-10-24 DIAGNOSIS — M810 Age-related osteoporosis without current pathological fracture: Secondary | ICD-10-CM

## 2011-10-24 NOTE — Addendum Note (Signed)
Addended by: Alvina Chou on: 10/24/2011 10:46 AM   Modules accepted: Orders

## 2011-10-31 ENCOUNTER — Other Ambulatory Visit (INDEPENDENT_AMBULATORY_CARE_PROVIDER_SITE_OTHER): Payer: BC Managed Care – PPO

## 2011-10-31 DIAGNOSIS — E7800 Pure hypercholesterolemia, unspecified: Secondary | ICD-10-CM

## 2011-10-31 DIAGNOSIS — M810 Age-related osteoporosis without current pathological fracture: Secondary | ICD-10-CM

## 2011-10-31 DIAGNOSIS — D649 Anemia, unspecified: Secondary | ICD-10-CM

## 2011-10-31 LAB — CBC WITH DIFFERENTIAL/PLATELET
Basophils Absolute: 0 10*3/uL (ref 0.0–0.1)
Eosinophils Absolute: 0.2 10*3/uL (ref 0.0–0.7)
Lymphocytes Relative: 37.2 % (ref 12.0–46.0)
MCHC: 32.7 g/dL (ref 30.0–36.0)
MCV: 96.5 fl (ref 78.0–100.0)
Monocytes Absolute: 0.4 10*3/uL (ref 0.1–1.0)
Neutrophils Relative %: 50.3 % (ref 43.0–77.0)
Platelets: 241 10*3/uL (ref 150.0–400.0)
RBC: 4.2 Mil/uL (ref 3.87–5.11)
RDW: 13.2 % (ref 11.5–14.6)

## 2011-10-31 LAB — COMPREHENSIVE METABOLIC PANEL
AST: 28 U/L (ref 0–37)
Albumin: 4 g/dL (ref 3.5–5.2)
Alkaline Phosphatase: 69 U/L (ref 39–117)
Potassium: 4.4 mEq/L (ref 3.5–5.1)
Sodium: 140 mEq/L (ref 135–145)
Total Bilirubin: 0.3 mg/dL (ref 0.3–1.2)
Total Protein: 6.9 g/dL (ref 6.0–8.3)

## 2011-10-31 LAB — LIPID PANEL
LDL Cholesterol: 88 mg/dL (ref 0–99)
Total CHOL/HDL Ratio: 2
Triglycerides: 46 mg/dL (ref 0.0–149.0)
VLDL: 9.2 mg/dL (ref 0.0–40.0)

## 2011-11-01 LAB — VITAMIN D 25 HYDROXY (VIT D DEFICIENCY, FRACTURES): Vit D, 25-Hydroxy: 35 ng/mL (ref 30–89)

## 2011-11-07 ENCOUNTER — Ambulatory Visit (INDEPENDENT_AMBULATORY_CARE_PROVIDER_SITE_OTHER)
Admission: RE | Admit: 2011-11-07 | Discharge: 2011-11-07 | Disposition: A | Discharge status: Home / Self Care | Payer: BC Managed Care – PPO | Source: Ambulatory Visit | Attending: Family Medicine | Admitting: Family Medicine

## 2011-11-07 ENCOUNTER — Encounter: Payer: Self-pay | Admitting: Family Medicine

## 2011-11-07 ENCOUNTER — Ambulatory Visit (INDEPENDENT_AMBULATORY_CARE_PROVIDER_SITE_OTHER): Payer: BC Managed Care – PPO | Admitting: Family Medicine

## 2011-11-07 VITALS — BP 133/82 | HR 79 | Temp 98.0°F | Ht 65.0 in | Wt 174.5 lb

## 2011-11-07 DIAGNOSIS — Z23 Encounter for immunization: Secondary | ICD-10-CM

## 2011-11-07 DIAGNOSIS — M818 Other osteoporosis without current pathological fracture: Secondary | ICD-10-CM

## 2011-11-07 DIAGNOSIS — E7800 Pure hypercholesterolemia, unspecified: Secondary | ICD-10-CM

## 2011-11-07 DIAGNOSIS — M549 Dorsalgia, unspecified: Secondary | ICD-10-CM

## 2011-11-07 DIAGNOSIS — M25559 Pain in unspecified hip: Secondary | ICD-10-CM

## 2011-11-07 DIAGNOSIS — Z1212 Encounter for screening for malignant neoplasm of rectum: Secondary | ICD-10-CM

## 2011-11-07 DIAGNOSIS — Z0000 Encounter for general adult medical examination without abnormal findings: Secondary | ICD-10-CM

## 2011-11-07 DIAGNOSIS — F341 Dysthymic disorder: Secondary | ICD-10-CM

## 2011-11-07 DIAGNOSIS — J3489 Other specified disorders of nose and nasal sinuses: Secondary | ICD-10-CM

## 2011-11-07 MED ORDER — VALACYCLOVIR HCL 1 G PO TABS: 1000.0000 mg | ORAL_TABLET | Freq: Two times a day (BID) | ORAL | Status: DC

## 2011-11-07 NOTE — Patient Instructions (Addendum)
Nasal sores are likely herpes virus.. At first sign of sore recurrent... Take valacyclovir as directed.  Stop at front desk to set up bone density. Stop by lab for iFoB. Work on regular exercise. We will call with X-ray results, Start low back stretches. Follow up in 2 weeks back pain and polyarthralgia 30 min OV.

## 2011-11-07 NOTE — Progress Notes (Signed)
Subjective:    Patient ID: [name], female    DOB: Mar 09, 1950, 61 y.o.   MRN: [id number]  HPI The patient is here for annual wellness exam and preventative care.    Recent trip  to [place] 2 weeks ago .Marland Kitchen She reinjured  Low back.. Previously on flexeril and mobic prn. She started these back, they did not help much initially.  She felt that the medication caused constipation and pressure in stomach. She continues to have low back pain, gradually improving. Also B hip pain and overall joint pain and stiffness.  She feels tight all over. She was exercising before trip to [place] and was doing well Mother with osteoarthritis.. No RA, no auto immune disease in family. She states she had fibromyalgia in 1990s.  11/2010.. Saw Dr. Patsy Lager for polyarthralgia.Marland Kitchen He checked Lyme titer negative.  Long term hip pain B... Cannot spread legs easily. Sore intermittantly. Concerned about ongoing issues.   Elevated Cholesterol: At goal on no medication. Lab Results  Component Value Date   CHOL 184 10/31/2011   HDL 86.60 10/31/2011   LDLCALC 88 10/31/2011   TRIG 46.0 10/31/2011   CHOLHDL 2 10/31/2011  Using medications without problems: Muscle aches:  Diet compliance: Exercise: Other complaints:  Had emergency room visit for hornet caused anaphylaxis. Now has epi-pen.  Anxiety and Depression: Moderate control of mood. NO SI. Controlled with exercise.   Review of Systems  Constitutional: Negative for fever, fatigue and unexpected weight change.  HENT: Negative for ear pain, congestion, sore throat, sneezing, trouble swallowing and sinus pressure.   Eyes: Negative for pain and itching.  Respiratory: Negative for cough, shortness of breath and wheezing.   Cardiovascular: Negative for chest pain, palpitations and leg swelling.  Gastrointestinal: Negative for nausea, abdominal pain, diarrhea, constipation and blood in stool.  Genitourinary: Negative for dysuria, hematuria, vaginal discharge, difficulty  urinating and menstrual problem.  Skin: Negative for rash.  Neurological: Negative for syncope, weakness, light-headedness, numbness and headaches.  Psychiatric/Behavioral: Negative for confusion and dysphoric mood. The patient is not nervous/anxious.    Tender nose sores intermittantly.Marland Kitchen Resolved on there on heir own... Possible herpes simplex 1.     Objective:   Physical Exam  Constitutional: She is oriented to person, place, and time. Vital signs are normal. She appears well-developed and well-nourished. She is cooperative.  Non-toxic appearance. She does not appear ill. No distress.  HENT:  Head: Normocephalic.  Right Ear: Hearing, tympanic membrane, external ear and ear canal normal.  Left Ear: Hearing, tympanic membrane, external ear and ear canal normal.  Nose: Nose normal.  Eyes: Conjunctivae normal, EOM and lids are normal. Pupils are equal, round, and reactive to light. No foreign bodies found.  Neck: Trachea normal and normal range of motion. Neck supple. Carotid bruit is not present. No mass and no thyromegaly present.  Cardiovascular: Normal rate, regular rhythm, S1 normal, S2 normal, normal heart sounds and intact distal pulses.  Exam reveals no gallop.   No murmur heard. Pulmonary/Chest: Effort normal and breath sounds normal. No respiratory distress. She has no wheezes. She has no rhonchi. She has no rales.  Abdominal: Soft. Normal appearance and bowel sounds are normal. She exhibits no distension, no fluid wave, no abdominal bruit and no mass. There is no hepatosplenomegaly. There is no tenderness. There is no rebound, no guarding and no CVA tenderness. No hernia.  Genitourinary: No breast swelling, tenderness, discharge or bleeding. Pelvic exam was performed with patient supine.  Musculoskeletal:  ttp in low back B paraspinous, no vertebral ttp, neg SLR, neg Faber's moderate ROM in B hips   Lymphadenopathy:    She has no cervical adenopathy.    She has no axillary  adenopathy.  Neurological: She is alert and oriented to person, place, and time. She has normal strength. No cranial nerve deficit or sensory deficit.  Skin: Skin is warm, dry and intact. No rash noted.  Psychiatric: Her speech is normal and behavior is normal. Judgment normal. Her mood appears not anxious. Cognition and memory are normal. She does not exhibit a depressed mood.          Assessment & Plan:  The patient's preventative maintenance and recommended screening tests for an annual wellness exam were reviewed in full today. Brought up to date unless services declined.  Counselled on the importance of diet, exercise, and its role in overall health and mortality. The patient's FH and SH was reviewed, including their home life, tobacco status, and drug and alcohol status.   Vaccines:uptodate, flu given. Nonsmoker Mammo: 09/2011 nml DEXA: osteoporosis, last stable in 05/2009, she is open to alendronate if osteoporosis same or worse. Colon: 2007  Dr. Christella Hartigan, due in 2012, not interested in repeating.  Wishes to do ifob instead given issues with last colonoscopy DVE/pap:not indicated TAH

## 2011-11-07 NOTE — Assessment & Plan Note (Signed)
Improving but persistant

## 2011-11-07 NOTE — Assessment & Plan Note (Signed)
Possible herepes simplex. No sores currently. Will given valacyclovir for trial.

## 2011-11-14 ENCOUNTER — Other Ambulatory Visit: Payer: BC Managed Care – PPO

## 2011-11-14 ENCOUNTER — Ambulatory Visit: Payer: Self-pay | Admitting: Family Medicine

## 2011-11-14 DIAGNOSIS — Z1212 Encounter for screening for malignant neoplasm of rectum: Secondary | ICD-10-CM

## 2011-11-15 ENCOUNTER — Encounter: Payer: Self-pay | Admitting: Family Medicine

## 2011-11-15 LAB — FECAL OCCULT BLOOD, GUAIAC: Fecal Occult Blood: NEGATIVE

## 2011-11-21 ENCOUNTER — Encounter: Payer: Self-pay | Admitting: Family Medicine

## 2011-11-21 ENCOUNTER — Ambulatory Visit (INDEPENDENT_AMBULATORY_CARE_PROVIDER_SITE_OTHER): Payer: BC Managed Care – PPO | Admitting: Family Medicine

## 2011-11-21 VITALS — BP 118/76 | HR 76 | Temp 98.4°F | Resp 20 | Ht 65.0 in | Wt 175.2 lb

## 2011-11-21 DIAGNOSIS — M549 Dorsalgia, unspecified: Secondary | ICD-10-CM

## 2011-11-21 NOTE — Assessment & Plan Note (Signed)
On exam today. Pain is more SI joint pain.  X-rays clear.  Focus on home PT. Offered PT referral, pt not interested in it at this time. Amitriptyline seems to help.. So there may be a fibromyalgia componnet to pain as she has had this in past.

## 2011-11-21 NOTE — Progress Notes (Signed)
Subjective:    Patient ID: [name], female    DOB: 08-Mar-1950, 61 y.o.   MRN: [id number]  HPI  61 year old female presents for follow up of low back pain and B hip pain.  X-rays of hips, pelvis and low back showed minimal degenerative changes on 11/07/11  Recent trip to [place] 2 weeks ago .Marland Kitchen She reinjured low back.. Previously on flexeril and mobic prn. She started these back, they did not help much initially.  She felt that the medication caused constipation and pressure in stomach.  She continues to have low back pain, gradually improving. Also B hip pain and overall joint pain and stiffness.  She feels tight all over.  She was exercising before trip to [place] and was doing well  Mother with osteoarthritis.. No RA, no auto immune disease in family.  She states she had fibromyalgia in 1990s.  11/2010.. Saw Dr. Patsy Lager for polyarthralgia.Marland Kitchen He checked Lyme titer negative.  Today she reports that low back pain is improving... She notes pain more in lateral pelvis... Over SI joints when she points. No falls. Rides horse.  May have aggravated it with sitting a lot. Notes pain with crossing legs.. Pain in low back not in anterior hips.  Felt better after using amitryptiline.   Review of Systems  Constitutional: Negative for fever and fatigue.  HENT: Negative for ear pain.   Eyes: Negative for pain.  Respiratory: Negative for chest tightness and shortness of breath.   Cardiovascular: Negative for chest pain, palpitations and leg swelling.  Gastrointestinal: Negative for abdominal pain.  Genitourinary: Negative for dysuria.       Objective:   Physical Exam  Constitutional: Vital signs are normal. She appears well-developed and well-nourished. She is cooperative.  Non-toxic appearance. She does not appear ill. No distress.  HENT:  Head: Normocephalic.  Right Ear: Hearing, tympanic membrane, external ear and ear canal normal. Tympanic membrane is not erythematous, not retracted and  not bulging.  Left Ear: Hearing, tympanic membrane, external ear and ear canal normal. Tympanic membrane is not erythematous, not retracted and not bulging.  Nose: No mucosal edema or rhinorrhea. Right sinus exhibits no maxillary sinus tenderness and no frontal sinus tenderness. Left sinus exhibits no maxillary sinus tenderness and no frontal sinus tenderness.  Mouth/Throat: Uvula is midline, oropharynx is clear and moist and mucous membranes are normal.  Eyes: Conjunctivae normal, EOM and lids are normal. Pupils are equal, round, and reactive to light. No foreign bodies found.  Neck: Trachea normal and normal range of motion. Neck supple. Carotid bruit is not present. No mass and no thyromegaly present.  Cardiovascular: Normal rate, regular rhythm, S1 normal, S2 normal, normal heart sounds, intact distal pulses and normal pulses.  Exam reveals no gallop and no friction rub.   No murmur heard. Pulmonary/Chest: Effort normal and breath sounds normal. Not tachypneic. No respiratory distress. She has no decreased breath sounds. She has no wheezes. She has no rhonchi. She has no rales.  Abdominal: Soft. Normal appearance and bowel sounds are normal. There is no tenderness.  Musculoskeletal:       Lumbar back: She exhibits tenderness. She exhibits no bony tenderness.       ttp over SI joint. Positive pain in SI joint with Faber's.  neg SLR.  Neurological: She is alert. She has normal strength. No cranial nerve deficit or sensory deficit. She displays a negative Romberg sign.  Skin: Skin is warm, dry and intact. No rash noted.  Psychiatric: Her  speech is normal and behavior is normal. Judgment and thought content normal. Her mood appears not anxious. Cognition and memory are normal. She does not exhibit a depressed mood.          Assessment & Plan:

## 2011-11-21 NOTE — Patient Instructions (Addendum)
May use amitryptiline for sleep and body pain. Focus on home physical therapy.  Review information packet.

## 2011-12-26 ENCOUNTER — Other Ambulatory Visit: Payer: BC Managed Care – PPO

## 2011-12-29 ENCOUNTER — Encounter: Payer: BC Managed Care – PPO | Admitting: Family Medicine

## 2012-05-07 ENCOUNTER — Telehealth: Payer: Self-pay | Admitting: Family Medicine

## 2012-05-07 NOTE — Telephone Encounter (Signed)
Patient Information:  Caller Name: Angela Chapman  Phone: [phone]  Patient: Angela Chapman  Gender: Female  DOB: 26-Jan-1951  Age: 62 Years  PCP: Kerby Nora (Family Practice)  Office Follow Up:  Does the office need to follow up with this patient?: No  Instructions For The Office: N/A   Symptoms  Reason For Call & Symptoms: Patient states she has b9one spurs and had an episode of severe pain.  Reviewed Health History In EMR: Yes  Reviewed Medications In EMR: Yes  Reviewed Allergies In EMR: Yes  Reviewed Surgeries / Procedures: Yes  Date of Onset of Symptoms: 04/18/2012  Treatments Tried: Flexeril  Treatments Tried Worked: Yes  Guideline(s) Used:  Back Pain  Disposition Per Guideline:   See Today or Tomorrow in Office  Reason For Disposition Reached:   Patient wants to be seen  Advice Given:  N/A  Patient Will Follow Care Advice:  YES  Appointment Scheduled:  05/08/2012 10:30:00 Appointment Scheduled Provider:  [name] (Family Practice)

## 2012-05-07 NOTE — Telephone Encounter (Signed)
appt with dr copland tomorrow

## 2012-05-08 ENCOUNTER — Encounter: Payer: Self-pay | Admitting: Family Medicine

## 2012-05-08 ENCOUNTER — Ambulatory Visit (INDEPENDENT_AMBULATORY_CARE_PROVIDER_SITE_OTHER): Payer: BC Managed Care – PPO | Admitting: Family Medicine

## 2012-05-08 VITALS — BP 130/80 | HR 90 | Temp 98.1°F | Ht 65.5 in | Wt 176.8 lb

## 2012-05-08 DIAGNOSIS — M549 Dorsalgia, unspecified: Secondary | ICD-10-CM

## 2012-05-08 MED ORDER — MELOXICAM 15 MG PO TABS: 15.0000 mg | ORAL_TABLET | Freq: Every day | ORAL | Status: DC

## 2012-05-08 MED ORDER — CYCLOBENZAPRINE HCL 10 MG PO TABS: 10.0000 mg | ORAL_TABLET | Freq: Every day | ORAL | Status: DC

## 2012-05-08 NOTE — Progress Notes (Signed)
   Patient Name: Angela Chapman Date of Birth: Jan 21, 1951 Medical Record Number: [id number] Gender: female  PCP: Kerby Nora, MD  History of Present Illness:  Angela Chapman is a 62 y.o. very pleasant female patient who presents with the following: Back Pain  ongoing for approximately: 1 week The patient has had back pain before. The back pain is localized into the lumbar spine area. They also describe no radiculopathy. Doing somewhat better today  No numbness or tingling. No bowel or bladder incontinence. No focal weakness. Physical therapy: No Chiropractic manipulations: No Acupuncture: No Osteopathic manipulation: No Heat or cold: helps some  Past Medical History, Surgical History, Family History, Medications, Allergies have been reviewed and updated if relevant.  Review of Systems  GEN: No fevers, chills. Nontoxic. Primarily MSK c/o today. MSK: Detailed in the HPI GI: tolerating PO intake without difficulty Neuro: As above  Otherwise the pertinent positives of the ROS are noted above.    Physical Exam  Filed Vitals:   05/08/12 1019  BP: 130/80  Pulse: 90  Temp: 98.1 F (36.7 C)  TempSrc: Oral  Height: 5' 5.5" (1.664 m)  Weight: 176 lb 12 oz (80.173 kg)  SpO2: 98%    Gen: Well-developed,well-nourished,in no acute distress; alert,appropriate and cooperative throughout examination HEENT: Normocephalic and atraumatic without obvious abnormalities.  Ears, externally no deformities Pulm: Breathing comfortably in no respiratory distress Range of motion at  the waist: Flexion, rotation and lateral bending: full  No echymosis or edema Rises to examination table with no difficulty Gait: minimally antalgic  Inspection/Deformity: No abnormality Paraspinus T:  Mild tenderness L4-s1  B Ankle Dorsiflexion (L5,4): 5/5 B Great Toe Dorsiflexion (L5,4): 5/5 Heel Walk (L5): WNL Toe Walk (S1): WNL Rise/Squat (L4): WNL, mild pain  SENSORY B Medial Foot (L4): WNL B Dorsum  (L5): WNL B Lateral (S1): WNL Light Touch: WNL Pinprick: WNL  REFLEXES Knee (L4): 2+ Ankle (S1): 2+  B SLR, seated: neg B SLR, supine: neg B FABER: neg B Reverse FABER: neg B Greater Troch: NT B Log Roll: neg B Stork: NT B Sciatic Notch: NT   Assessment and Plan:  Back pain  I reviewed with the patient the structures involved and how they related to diagnosis.   Conservative algorithms for acute back pain generally begin with the following: NSAIDS Muscle Relaxants Mild pain medication  Start with medications, core rehab, and progress from there following low back pain algorithm. No red flags are present.   Meds ordered this encounter  Medications  . cyclobenzaprine (FLEXERIL) 10 MG tablet    Sig: Take 1 tablet (10 mg total) by mouth at bedtime.    Dispense:  30 tablet    Refill:  2  . meloxicam (MOBIC) 15 MG tablet    Sig: Take 1 tablet (15 mg total) by mouth daily.    Dispense:  30 tablet    Refill:  2

## 2012-10-18 ENCOUNTER — Ambulatory Visit (INDEPENDENT_AMBULATORY_CARE_PROVIDER_SITE_OTHER): Payer: BC Managed Care – PPO | Admitting: Internal Medicine

## 2012-10-18 ENCOUNTER — Ambulatory Visit: Payer: BC Managed Care – PPO | Admitting: Family Medicine

## 2012-10-18 ENCOUNTER — Encounter: Payer: Self-pay | Admitting: Internal Medicine

## 2012-10-18 VITALS — BP 118/70 | HR 78 | Temp 98.0°F | Wt 178.0 lb

## 2012-10-18 DIAGNOSIS — J029 Acute pharyngitis, unspecified: Secondary | ICD-10-CM

## 2012-10-18 MED ORDER — MONTELUKAST SODIUM 10 MG PO TABS: 10.0000 mg | ORAL_TABLET | Freq: Every day | ORAL | Status: DC

## 2012-10-18 NOTE — Progress Notes (Signed)
  Subjective:    Patient ID: Angela Chapman, female    DOB: 1950-04-26, 62 y.o.   MRN: [id number]  HPI Chronic allergy problems Feels that things are working up with the ragweed starting Allegra not helping Chlor trimeton on top of that also---but not really controlling  Uses nasonex sparingly---causes sores in her nose Tried it first this morning  Going on vacation next week---to [facility] to be well  Now with sore throat for 2 -3 days also now Ears stopped up No fever Doesn't really feel like she has infection  claritin wasn't effective Uses pseudofed at times also  Current Outpatient Prescriptions on File Prior to Visit  Medication Sig Dispense Refill  . acetaminophen (TYLENOL) 325 MG tablet Take 650 mg by mouth every 6 (six) hours as needed.        Marland Kitchen amitriptyline (ELAVIL) 10 MG tablet Take 10 mg by mouth at bedtime as needed.       . Calcium Carbonate-Vitamin D (CALCIUM 600+D) 600-400 MG-UNIT per tablet Take 1 tablet by mouth 2 (two) times daily.        . cyclobenzaprine (FLEXERIL) 10 MG tablet Take 1 tablet (10 mg total) by mouth at bedtime.  30 tablet  2  . famotidine (PEPCID) 20 MG tablet Take 20 mg by mouth 2 (two) times daily.        . meloxicam (MOBIC) 15 MG tablet Take 1 tablet (15 mg total) by mouth daily.  30 tablet  2   No current facility-administered medications on file prior to visit.    Allergies  Allergen Reactions  . Oxycodone-Acetaminophen     REACTION: rash  . Sulfonamide Derivatives     REACTION: Swelling    Past Medical History  Diagnosis Date  . Allergy   . Depression   . Fibromyalgia     Past Surgical History  Procedure Laterality Date  . Abdominal hysterectomy      Family History  Problem Relation Age of Onset  . Arthritis Mother   . Cancer Mother     breast  . Kidney disease Mother   . Diabetes Mother   . Stroke Father     History   Social History  . Marital Status: Married    Spouse Name: N/A    Number of  Children: N/A  . Years of Education: N/A   Occupational History  . 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 Smoking status: Never Smoker   . Smokeless tobacco: Never Used  . Alcohol Use: No  . Drug Use: No  . Sexual Activity: Not on file   Other Topics Concern  . Not on file   Social History Narrative   Regular exercise yes 3 time weekly         Review of Systems No vomiting or diarrhea Appetite is okay    Objective:   Physical Exam  Constitutional: She appears well-developed and well-nourished. No distress.  HENT:  Mouth/Throat: Oropharynx is clear and moist. No oropharyngeal exudate.  Mild maxillary tenderness Moderate mostly pale nasal congestion TMs normal  Neck: Normal range of motion. Neck supple. No thyromegaly present.  Pulmonary/Chest: Effort normal and breath sounds normal. No respiratory distress. She has no wheezes. She has no rales.  Lymphadenopathy:    She has no cervical adenopathy.          Assessment & Plan:

## 2012-10-18 NOTE — Assessment & Plan Note (Signed)
Seems to be allergic Has some chest tightness at times also Will add montelukast to allegra Hold off on nasonex---not clear she can tolerate enough for it to be therapeutic If not better next week, may want to try adding cetirizine

## 2012-10-22 ENCOUNTER — Ambulatory Visit: Payer: BC Managed Care – PPO | Admitting: Family Medicine

## 2012-11-15 ENCOUNTER — Telehealth: Payer: Self-pay | Admitting: Family Medicine

## 2012-11-15 NOTE — Telephone Encounter (Signed)
Caller: Angela Chapman/Patient; Phone: [phone]; Reason for Call: Patient received let to call and schedule appointment and wants to check to see if time to schedule for mammogram .

## 2012-11-15 NOTE — Telephone Encounter (Signed)
Spoke with Angela Chapman.  Advised her that she is due for her mammogram and she can call to scheduled her own appointment.  Phone number given to patient to call.  Also scheduled patient for her CPX with pre-lab appointment for Dr. Ermalene Searing for October.

## 2012-11-15 NOTE — Telephone Encounter (Signed)
Left message for patient to return my call.  Last mammogram was 10/12/2011 at [facility].

## 2012-11-18 ENCOUNTER — Ambulatory Visit: Payer: Self-pay | Admitting: Family Medicine

## 2012-11-18 ENCOUNTER — Encounter: Payer: Self-pay | Admitting: Family Medicine

## 2012-11-29 ENCOUNTER — Other Ambulatory Visit (INDEPENDENT_AMBULATORY_CARE_PROVIDER_SITE_OTHER): Payer: BC Managed Care – PPO

## 2012-11-29 ENCOUNTER — Telehealth: Payer: Self-pay | Admitting: Family Medicine

## 2012-11-29 DIAGNOSIS — E7800 Pure hypercholesterolemia, unspecified: Secondary | ICD-10-CM

## 2012-11-29 DIAGNOSIS — M818 Other osteoporosis without current pathological fracture: Secondary | ICD-10-CM

## 2012-11-29 LAB — COMPREHENSIVE METABOLIC PANEL
AST: 21 U/L (ref 0–37)
BUN: 15 mg/dL (ref 6–23)
CO2: 29 mEq/L (ref 19–32)
Calcium: 9.1 mg/dL (ref 8.4–10.5)
Chloride: 105 mEq/L (ref 96–112)
Creatinine, Ser: 0.8 mg/dL (ref 0.4–1.2)
GFR: 75.57 mL/min (ref >60.00)
Total Bilirubin: 0.6 mg/dL (ref 0.3–1.2)

## 2012-11-29 LAB — LIPID PANEL
HDL: 71.6 mg/dL (ref >39.00)
Triglycerides: 49 mg/dL (ref 0.0–149.0)
VLDL: 9.8 mg/dL (ref 0.0–40.0)

## 2012-11-29 NOTE — Telephone Encounter (Signed)
Message copied by Excell Seltzer on Fri Nov 29, 2012  8:32 AM ------      Message from: Alvina Chou      Created: Wed Nov 20, 2012 11:59 AM      Regarding: Lab orders for Friday, 10.10.14       Patient is scheduled for CPX labs, please order future labs, Thanks , Terri       ------

## 2012-12-03 ENCOUNTER — Ambulatory Visit (INDEPENDENT_AMBULATORY_CARE_PROVIDER_SITE_OTHER): Payer: BC Managed Care – PPO | Admitting: Family Medicine

## 2012-12-03 ENCOUNTER — Encounter: Payer: Self-pay | Admitting: Family Medicine

## 2012-12-03 ENCOUNTER — Encounter: Payer: Self-pay | Admitting: Gastroenterology

## 2012-12-03 VITALS — BP 120/70 | HR 68 | Temp 98.2°F | Ht 64.5 in | Wt 175.5 lb

## 2012-12-03 DIAGNOSIS — J3489 Other specified disorders of nose and nasal sinuses: Secondary | ICD-10-CM

## 2012-12-03 DIAGNOSIS — E7800 Pure hypercholesterolemia, unspecified: Secondary | ICD-10-CM

## 2012-12-03 DIAGNOSIS — Z0000 Encounter for general adult medical examination without abnormal findings: Secondary | ICD-10-CM

## 2012-12-03 DIAGNOSIS — Z1212 Encounter for screening for malignant neoplasm of rectum: Secondary | ICD-10-CM

## 2012-12-03 MED ORDER — VALACYCLOVIR HCL 1 G PO TABS: 1000.0000 mg | ORAL_TABLET | ORAL | Status: DC | PRN

## 2012-12-03 NOTE — Progress Notes (Signed)
The patient is here for annual wellness exam and preventative care.   Sores in nose monthly... Resolved in 24 hours with valacyclovir. Seen in ER in 10/2012 for ? Lymphadenopathy occuring afterward, ? related.  Given bacterial medication.Marland Kitchen Resolved.  Elevated Cholesterol: At goal on no medication.  Lab Results  Component Value Date   CHOL 177 11/29/2012   HDL 71.60 11/29/2012   LDLCALC 96 11/29/2012   TRIG 49.0 11/29/2012   CHOLHDL 2 11/29/2012  Using medications without problems:  None Muscle aches: None Diet compliance:  Exercise:  Other complaints:   Anxiety and Depression: Moderate control of mood. No SI. Controlled with exercise.   Review of Systems  Constitutional: Negative for fever, fatigue and unexpected weight change.  HENT: Negative for ear pain, congestion, sore throat, sneezing, trouble swallowing and sinus pressure.  Eyes: Negative for pain and itching.  Respiratory: Negative for cough, shortness of breath and wheezing.  Cardiovascular: Negative for chest pain, palpitations and leg swelling.  Gastrointestinal: Negative for nausea, abdominal pain, diarrhea, constipation and blood in stool.  Genitourinary: Negative for dysuria, hematuria, vaginal discharge, difficulty urinating and menstrual problem.  Skin: Negative for rash.  Neurological: Negative for syncope, weakness, light-headedness, numbness and headaches.  Psychiatric/Behavioral: Negative for confusion and dysphoric mood. The patient is not nervous/anxious.  Tender nose sores intermittantly.Marland Kitchen Resolved on there on their own... Possible herpes simplex 1.  Objective:   Physical Exam  Constitutional: She is oriented to person, place, and time. Vital signs are normal. She appears well-developed and well-nourished. She is cooperative. Non-toxic appearance. She does not appear ill. No distress.  HENT:  Head: Normocephalic.  Right Ear: Hearing, tympanic membrane, external ear and ear canal normal.  Left Ear: Hearing,  tympanic membrane, external ear and ear canal normal.  Nose: Nose normal.  Eyes: Conjunctivae normal, EOM and lids are normal. Pupils are equal, round, and reactive to light. No foreign bodies found.  Neck: Trachea normal and normal range of motion. Neck supple. Carotid bruit is not present. No mass and no thyromegaly present.  Cardiovascular: Normal rate, regular rhythm, S1 normal, S2 normal, normal heart sounds and intact distal pulses. Exam reveals no gallop.  No murmur heard.  Pulmonary/Chest: Effort normal and breath sounds normal. No respiratory distress. She has no wheezes. She has no rhonchi. She has no rales.  Abdominal: Soft. Normal appearance and bowel sounds are normal. She exhibits no distension, no fluid wave, no abdominal bruit and no mass. There is no hepatosplenomegaly. There is no tenderness. There is no rebound, no guarding and no CVA tenderness. No hernia.  Genitourinary: No breast swelling, tenderness, discharge or bleeding. Pelvic exam was performed with patient supine.  Musculoskeletal:  Lymphadenopathy:  She has no cervical adenopathy.  She has no axillary adenopathy.  Neurological: She is alert and oriented to person, place, and time. She has normal strength. No cranial nerve deficit or sensory deficit.  Skin: Skin is warm, dry and intact. No rash noted.  Nodule/Cyst on right forefinger, nontender. Psychiatric: Her speech is normal and behavior is normal. Judgment normal. Her mood appears not anxious. Cognition and memory are normal. She does not exhibit a depressed mood.  Assessment & Plan:   The patient's preventative maintenance and recommended screening tests for an annual wellness exam were reviewed in full today.  Brought up to date unless services declined.  Counselled on the importance of diet, exercise, and its role in overall health and mortality.  The patient's FH and SH was reviewed, including their  home life, tobacco status, and drug and alcohol status.    Vaccines:uptodate, flu given, looking into shingles. Nonsmoker  Mammo: 10/2012 nml  DEXA: osteoporosis, improved 10/2011, recheck in 2-5 years. Colon: 2007 Dr. Christella Hartigan, due in 2012, she has decided to move forward with repeat.  DVE/pap:not indicated TAH.

## 2012-12-03 NOTE — Addendum Note (Signed)
Addended by: Kerby Nora E on: 12/03/2012 09:50 AM   Modules accepted: Orders

## 2012-12-03 NOTE — Addendum Note (Signed)
Addended by: Damita Lack on: 12/03/2012 09:54 AM   Modules accepted: Orders

## 2012-12-03 NOTE — Patient Instructions (Addendum)
Look into shingles vaccines coverage. Call Dr. Christella Hartigan to set up repeat colon cancer screening. If lesion on finger not improving call for ? Derm referral.

## 2012-12-10 ENCOUNTER — Ambulatory Visit (AMBULATORY_SURGERY_CENTER): Payer: Self-pay | Admitting: *Deleted

## 2012-12-10 VITALS — Ht 64.5 in | Wt 175.0 lb

## 2012-12-10 DIAGNOSIS — Z860100 Personal history of colon polyps, unspecified: Secondary | ICD-10-CM

## 2012-12-10 MED ORDER — PEG-KCL-NACL-NASULF-NA ASC-C 100 G PO SOLR: ORAL | Status: DC

## 2012-12-11 ENCOUNTER — Encounter: Payer: Self-pay | Admitting: Gastroenterology

## 2012-12-23 ENCOUNTER — Telehealth: Payer: Self-pay | Admitting: Family Medicine

## 2012-12-23 ENCOUNTER — Other Ambulatory Visit: Payer: BC Managed Care – PPO | Admitting: Gastroenterology

## 2012-12-23 NOTE — Telephone Encounter (Signed)
Patient Information:  Caller Name: Kjersten  Phone: [phone]  Patient: Angela Chapman  Gender: Female  DOB: 01-03-1951  Age: 62 Years  PCP: Kerby Nora (Family Practice)  Office Follow Up:  Does the office need to follow up with this patient?: No  Instructions For The Office: N/A  RN Note:  Can't take a deep breath but has no SOB.  Pain is a 10 with deep breath on 0-10.  Not trying breath deeply feels pressure like someone "hold breast" But on movement it is a 10 on 0/10.  Currently in recliner and husband is on the way home.  Eno emergent symptoms noted in relation to cardiac. Patient is reluctant to go to ED.  RN verified with Carlena Sax in office that ED disposition was correct and that there were no other appointments.  Information given to patient and husband.  Not sure if will go to ED or immediate care but is taking her to care.  Symptoms  Reason For Call & Symptoms: Larey Seat in yard with dog and tripped over animal.  Flipped flat on chest more on left side. This happened at 10:00 am.  Pain has progressively worsen. Knocked breath up.  Reviewed Health History In EMR: Yes  Reviewed Medications In EMR: Yes  Reviewed Allergies In EMR: Yes  Reviewed Surgeries / Procedures: Yes  Date of Onset of Symptoms: 12/23/2012  Treatments Tried: Tylenol without relief  Treatments Tried Worked: Yes  Guideline(s) Used:  Chest Injury  Disposition Per Guideline:   Go to ED Now  Reason For Disposition Reached:   Severe chest pain  Advice Given:  N/A  Patient Will Follow Care Advice:  YES

## 2012-12-24 NOTE — Telephone Encounter (Signed)
Agree with Er dispo.

## 2012-12-25 ENCOUNTER — Ambulatory Visit (INDEPENDENT_AMBULATORY_CARE_PROVIDER_SITE_OTHER): Payer: BC Managed Care – PPO | Admitting: Family Medicine

## 2012-12-25 ENCOUNTER — Encounter: Payer: Self-pay | Admitting: Family Medicine

## 2012-12-25 ENCOUNTER — Ambulatory Visit (INDEPENDENT_AMBULATORY_CARE_PROVIDER_SITE_OTHER)
Admission: RE | Admit: 2012-12-25 | Discharge: 2012-12-25 | Disposition: A | Discharge status: Home / Self Care | Payer: BC Managed Care – PPO | Source: Ambulatory Visit | Attending: Family Medicine | Admitting: Family Medicine

## 2012-12-25 VITALS — BP 116/68 | HR 67 | Temp 98.3°F | Wt 175.0 lb

## 2012-12-25 DIAGNOSIS — R0781 Pleurodynia: Secondary | ICD-10-CM

## 2012-12-25 DIAGNOSIS — R079 Chest pain, unspecified: Secondary | ICD-10-CM

## 2012-12-25 MED ORDER — MELOXICAM 15 MG PO TABS: 15.0000 mg | ORAL_TABLET | Freq: Every day | ORAL | Status: DC

## 2012-12-25 MED ORDER — TRAMADOL HCL 50 MG PO TABS: 50.0000 mg | ORAL_TABLET | Freq: Four times a day (QID) | ORAL | Status: DC | PRN

## 2012-12-25 NOTE — Progress Notes (Signed)
Pre-visit discussion using our clinic review tool. No additional management support is needed unless otherwise documented below in the visit note.  

## 2012-12-25 NOTE — Progress Notes (Signed)
Date:  12/25/2012   Name:  Angela Chapman   DOB:  Jun 29, 1950   MRN:  [id number] Gender: female Age: 62 y.o.  Primary Physician:  Kerby Nora, MD   Chief Complaint: pain in left chest after fall   History of Present Illness:  Angela Chapman is a 63 y.o. pleasant patient who presents with the following:  Monday, was out in the yeard and turned and fell really fast and hit on the ground and landed on left side. Had to lay for a minute. She has been in severe pain ever since then. Some difficulty breathing and with movement. No bruising currently. No SOB. Pulse ox 97.    Patient Active Problem List   Diagnosis Date Noted  . Nasal sore, recurrent 12/03/2012  . BACK PAIN 03/17/2010  . GERD 12/10/2009  . ANXIETY DEPRESSION 11/23/2009  . HIP PAIN, BILATERAL 07/09/2009  . Other osteoporosis 06/09/2009  . PURE HYPERCHOLESTEROLEMIA 05/18/2009  . ALLERGIC RHINITIS 05/04/2008    Past Medical History  Diagnosis Date  . Allergy   . Fibromyalgia   . Blood transfusion without reported diagnosis   . Depression     no per pt  . GERD (gastroesophageal reflux disease)     Past Surgical History  Procedure Laterality Date  . Abdominal hysterectomy    . Colonoscopy    . Wrist surgery      left    History   Social History  . Marital Status: Married    Spouse Name: N/A    Number of Children: N/A  . Years of Education: N/A   Occupational History  . 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 Smoking status: Never Smoker   . Smokeless tobacco: Never Used  . Alcohol Use: No  . Drug Use: No  . Sexual Activity: Not on file   Other Topics Concern  . Not on file   Social History Narrative   Regular exercise yes 3 time weekly          Family History  Problem Relation Age of Onset  . Arthritis Mother   . Cancer Mother     breast  . Kidney disease Mother   . Diabetes Mother   . Stroke Father   . Colon cancer Neg Hx   . Esophageal cancer Neg Hx   . Rectal  cancer Neg Hx   . Stomach cancer Neg Hx     Allergies  Allergen Reactions  . Oxycodone-Acetaminophen     REACTION: rash  . Sulfonamide Derivatives     REACTION: Swelling    Medication list has been reviewed and updated.  Review of Systems:   GEN: No fevers, chills. Nontoxic. Primarily MSK c/o today. MSK: Detailed in the HPI GI: tolerating PO intake without difficulty Neuro: No numbness, parasthesias, or tingling associated. Otherwise the pertinent positives of the ROS are noted above.    Physical Examination: BP 116/68  Pulse 67  Temp(Src) 98.3 F (36.8 C) (Oral)  Wt 175 lb (79.379 kg)  SpO2 97%  Ideal Body Weight:     GEN: WDWN, NAD, Non-toxic, Alert & Oriented x 3 HEENT: Atraumatic, Normocephalic.  Ears and Nose: No external deformity. EXTR: No clubbing/cyanosis/edema NEURO: Normal gait.  PSYCH: Normally interactive. Conversant. Not depressed or anxious appearing.  Calm demeanor.  CHEST: NT R Ribs. L breast tissue TTP, just inferior to this on the LEFT, she is markedly tender to palpation anteriorly.  Dg Ribs Unilateral Left  12/25/2012  CLINICAL DATA:  Left rib pain.  EXAM: LEFT RIBS - 2 VIEW  COMPARISON:  None.  FINDINGS: No fracture or other bone lesions are seen involving the ribs.  IMPRESSION: No acute osseous injury of the left ribs.   Electronically Signed   By: Elige Ko   On: 12/25/2012 12:47   I think there may be a very subtle lucency on the L rib 2nd from bottom [name], MD   Assessment and Plan:  Rib pain on left side - Plan: DG Ribs Unilateral Left  Likely rib fx. Mobic scheduled and tramadol prn. Expect at least 4 weeks to resolve.  There are no Patient Instructions on file for this visit.  Orders Today:  Orders Placed This Encounter  Procedures  . DG Ribs Unilateral Left    Standing Status: Future     Number of Occurrences: 1     Standing Expiration Date: 02/24/2014    Order Specific Question:  Reason for Exam (SYMPTOM  OR  DIAGNOSIS REQUIRED)    Answer:  probable rib fracture    Order Specific Question:  Is the patient pregnant?    Answer:  No    Order Specific Question:  Preferred imaging location?    Answer:  [facility]    New medications, updates to list, dose adjustments: Meds ordered this encounter  Medications  . traMADol (ULTRAM) 50 MG tablet    Sig: Take 1-2 tablets (50-100 mg total) by mouth every 6 (six) hours as needed for moderate pain or severe pain.    Dispense:  200 tablet    Refill:  0  . meloxicam (MOBIC) 15 MG tablet    Sig: Take 1 tablet (15 mg total) by mouth daily.    Dispense:  30 tablet    Refill:  2    Signed,  Mccade Sullenberger T. Jakyiah Briones, MD, CAQ Sports Medicine  [facility] [address] [place] [address] Phone: [phone] Fax: [phone]  Updated Complete Medication List:   Medication List       This list is accurate as of: 12/25/12 11:59 PM.  Always use your most recent med list.               acetaminophen 325 MG tablet  Commonly known as:  TYLENOL  Take 650 mg by mouth every 6 (six) hours as needed.     amitriptyline 10 MG tablet  Commonly known as:  ELAVIL  Take 10 mg by mouth at bedtime as needed.     CALCIUM 600+D 600-400 MG-UNIT per tablet  Generic drug:  Calcium Carbonate-Vitamin D  Take 1 tablet by mouth 2 (two) times daily.     cyclobenzaprine 10 MG tablet  Commonly known as:  FLEXERIL  Take 1 tablet (10 mg total) by mouth at bedtime.     famotidine 20 MG tablet  Commonly known as:  PEPCID  Take 20 mg by mouth 2 (two) times daily.     GAVISCON PO  Take by mouth as needed.     meloxicam 15 MG tablet  Commonly known as:  MOBIC  Take 1 tablet (15 mg total) by mouth daily.     montelukast 10 MG tablet  Commonly known as:  SINGULAIR  Take 1 tablet (10 mg total) by mouth daily.     peg 3350 powder 100 G Solr  Commonly known as:  MOVIPREP  Moviprep as directed, no substitutions     traMADol 50 MG  tablet  Commonly  known as:  ULTRAM  Take 1-2 tablets (50-100 mg total) by mouth every 6 (six) hours as needed for moderate pain or severe pain.     valACYclovir 1000 MG tablet  Commonly known as:  VALTREX  Take 1 tablet (1,000 mg total) by mouth as needed.

## 2013-01-29 ENCOUNTER — Ambulatory Visit (INDEPENDENT_AMBULATORY_CARE_PROVIDER_SITE_OTHER): Payer: BC Managed Care – PPO | Admitting: Family Medicine

## 2013-01-29 ENCOUNTER — Encounter: Payer: Self-pay | Admitting: Family Medicine

## 2013-01-29 ENCOUNTER — Ambulatory Visit: Payer: BC Managed Care – PPO | Admitting: Family Medicine

## 2013-01-29 VITALS — BP 138/70 | HR 80 | Temp 98.0°F | Ht 64.5 in | Wt 172.8 lb

## 2013-01-29 DIAGNOSIS — L259 Unspecified contact dermatitis, unspecified cause: Secondary | ICD-10-CM

## 2013-01-29 DIAGNOSIS — L239 Allergic contact dermatitis, unspecified cause: Secondary | ICD-10-CM

## 2013-01-29 MED ORDER — TRIAMCINOLONE ACETONIDE 0.1 % EX CREA: 1.0000 "application " | TOPICAL_CREAM | Freq: Two times a day (BID) | CUTANEOUS | Status: DC

## 2013-01-29 MED ORDER — PREDNISONE 20 MG PO TABS: ORAL_TABLET | ORAL | Status: DC

## 2013-01-29 NOTE — Progress Notes (Signed)
Pre-visit discussion using our clinic review tool. No additional management support is needed unless otherwise documented below in the visit note.  

## 2013-01-29 NOTE — Progress Notes (Signed)
Date:  01/29/2013   Name:  Angela Chapman   DOB:  [birth year]   MRN:  [id number] Gender: female Age: 62 y.o.  Primary Physician:  Kerby Nora, MD   Chief Complaint: Area on Arm & Stomach   Subjective:   History of Present Illness:  Angela Chapman is a 62 y.o. very pleasant female patient who presents with the following:  2-3 weeks.  LEFT arm, anterior vesicles. Using cortisone 10, not really helping all that much.   Also put some antifungal stuff on it.  She also took some valtrex 1000 mg, of valtrex bid.  Itchy and somewhat painful  Past Medical History, Surgical History, Social History, Family History, Problem List, Medications, and Allergies have been reviewed and updated if relevant.  Review of Systems:  GEN: No acute illnesses, no fevers, chills. GI: No n/v/d, eating normally Pulm: No SOB Interactive and getting along well at home.  Otherwise, ROS is as per the HPI.  Objective:   Physical Examination: BP 138/70  Pulse 80  Temp(Src) 98 F (36.7 C) (Oral)  Ht 5' 4.5" (1.638 m)  Wt 172 lb 12 oz (78.359 kg)  BMI 29.21 kg/m2   GEN: WDWN, NAD, Non-toxic, Alert & Oriented x 3 HEENT: Atraumatic, Normocephalic.  Ears and Nose: No external deformity. EXTR: No clubbing/cyanosis/edema NEURO: Normal gait.  PSYCH: Normally interactive. Conversant. Not depressed or anxious appearing.  Calm demeanor.  L ant forearm, large area of vesicles with surrounding redness, smaller lesions on abdomen.  Laboratory and Imaging Data:  Assessment & Plan:    Allergic dermatitis  Not c/w shingles Likely allergic, unclear cause.   There are no Patient Instructions on file for this visit.  Orders Today:  No orders of the defined types were placed in this encounter.    New medications, updates to list, dose adjustments: Meds ordered this encounter  Medications  . triamcinolone cream (KENALOG) 0.1 %    Sig: Apply 1 application topically 2 (two) times daily.    Dispense:   454 g    Refill:  1  . predniSONE (DELTASONE) 20 MG tablet    Sig: 2 tabs po daily for 5 days, then 1 tablet po daily    Dispense:  15 tablet    Refill:  0    Signed,  Quianna Avery T. Nasario Czerniak, MD, CAQ Sports Medicine  [facility] [address] [place] [address] Phone: [phone] Fax: [phone]  Updated Complete Medication List:   Medication List       This list is accurate as of: 01/29/13  3:16 PM.  Always use your most recent med list.               acetaminophen 325 MG tablet  Commonly known as:  TYLENOL  Take 650 mg by mouth every 6 (six) hours as needed.     amitriptyline 10 MG tablet  Commonly known as:  ELAVIL  Take 10 mg by mouth at bedtime as needed.     CALCIUM 600+D 600-400 MG-UNIT per tablet  Generic drug:  Calcium Carbonate-Vitamin D  Take 1 tablet by mouth 2 (two) times daily.     cyclobenzaprine 10 MG tablet  Commonly known as:  FLEXERIL  Take 1 tablet (10 mg total) by mouth at bedtime.     famotidine 20 MG tablet  Commonly known as:  PEPCID  Take 20 mg by mouth 2 (two) times daily.     GAVISCON PO  Take by mouth as  needed.     meloxicam 15 MG tablet  Commonly known as:  MOBIC  Take 1 tablet (15 mg total) by mouth daily.     montelukast 10 MG tablet  Commonly known as:  SINGULAIR  Take 1 tablet (10 mg total) by mouth daily.     peg 3350 powder 100 G Solr  Commonly known as:  MOVIPREP  Moviprep as directed, no substitutions     predniSONE 20 MG tablet  Commonly known as:  DELTASONE  2 tabs po daily for 5 days, then 1 tablet po daily     traMADol 50 MG tablet  Commonly known as:  ULTRAM  Take 1-2 tablets (50-100 mg total) by mouth every 6 (six) hours as needed for moderate pain or severe pain.     triamcinolone cream 0.1 %  Commonly known as:  KENALOG  Apply 1 application topically 2 (two) times daily.     valACYclovir 1000 MG tablet  Commonly known as:  VALTREX  Take 1 tablet (1,000 mg total) by  mouth as needed.

## 2013-02-25 ENCOUNTER — Telehealth: Payer: Self-pay | Admitting: Gastroenterology

## 2013-02-25 DIAGNOSIS — Z86010 Personal history of colonic polyps: Secondary | ICD-10-CM

## 2013-02-25 MED ORDER — PEG-KCL-NACL-NASULF-NA ASC-C 100 G PO SOLR: ORAL | Status: DC

## 2013-02-25 NOTE — Telephone Encounter (Signed)
rx has been resent per pt request

## 2013-03-05 ENCOUNTER — Other Ambulatory Visit: Payer: BC Managed Care – PPO | Admitting: Gastroenterology

## 2013-04-07 ENCOUNTER — Telehealth: Payer: Self-pay | Admitting: Family Medicine

## 2013-04-07 MED ORDER — MOMETASONE FUROATE 50 MCG/ACT NA SUSP: 2.0000 | Freq: Every day | NASAL | Status: DC

## 2013-04-07 NOTE — Telephone Encounter (Signed)
Pt is needing a new rx for her Nasonex 50 mcg 125 sprays 2 sprays daily. Pt uses [facility] [place]

## 2013-04-07 NOTE — Telephone Encounter (Signed)
Left message for Harriett Sineancy that prescription for Nasonex has been sent to her pharmacy.

## 2013-04-16 ENCOUNTER — Encounter: Payer: Self-pay | Admitting: Gastroenterology

## 2013-04-16 ENCOUNTER — Ambulatory Visit (AMBULATORY_SURGERY_CENTER): Payer: BC Managed Care – PPO | Admitting: Gastroenterology

## 2013-04-16 VITALS — BP 128/77 | HR 61 | Temp 97.0°F | Resp 15 | Ht 64.5 in | Wt 175.0 lb

## 2013-04-16 DIAGNOSIS — Z1211 Encounter for screening for malignant neoplasm of colon: Secondary | ICD-10-CM

## 2013-04-16 DIAGNOSIS — Z86010 Personal history of colonic polyps: Secondary | ICD-10-CM

## 2013-04-16 MED ORDER — SODIUM CHLORIDE 0.9 % IV SOLN: 500.0000 mL | INTRAVENOUS | Status: DC

## 2013-04-16 NOTE — Progress Notes (Signed)
Pt aware that Propofol will be used, no egg or soy allergy  Pt states "after my last colonoscopy, I was stretched so badly that I leaked for weeks- stool."  I told her to tell Dr. Christella HartiganJacobs about this and understanding voiced

## 2013-04-16 NOTE — Progress Notes (Signed)
Lidocaine-40mg IV prior to Propofol InductionPropofol given over incremental dosages 

## 2013-04-16 NOTE — Patient Instructions (Signed)
YOU HAD AN ENDOSCOPIC PROCEDURE TODAY AT THE [facility]: Refer to the procedure report that was given to you for any specific questions about what was found during the examination.  If the procedure report does not answer your questions, please call your gastroenterologist to clarify.  If you requested that your care partner not be given the details of your procedure findings, then the procedure report has been included in a sealed envelope for you to review at your convenience later.  YOU SHOULD EXPECT: Some feelings of bloating in the abdomen. Passage of more gas than usual.  Walking can help get rid of the air that was put into your GI tract during the procedure and reduce the bloating. If you had a lower endoscopy (such as a colonoscopy or flexible sigmoidoscopy) you may notice spotting of blood in your stool or on the toilet paper. If you underwent a bowel prep for your procedure, then you may not have a normal bowel movement for a few days.  DIET: Your first meal following the procedure should be a light meal and then it is ok to progress to your normal diet.  A half-sandwich or bowl of soup is an example of a good first meal.  Heavy or fried foods are harder to digest and may make you feel nauseous or bloated.  Likewise meals heavy in dairy and vegetables can cause extra gas to form and this can also increase the bloating.  Drink plenty of fluids but you should avoid alcoholic beverages for 24 hours.  ACTIVITY: Your care partner should take you home directly after the procedure.  You should plan to take it easy, moving slowly for the rest of the day.  You can resume normal activity the day after the procedure however you should NOT DRIVE or use heavy machinery for 24 hours (because of the sedation medicines used during the test).    SYMPTOMS TO REPORT IMMEDIATELY: A gastroenterologist can be reached at any hour.  During normal business hours, 8:30 AM to 5:00 PM Monday through Friday,  call [phone].  After hours and on weekends, please call the GI answering service at [phone] who will take a message and have the physician on call contact you.   Following lower endoscopy (colonoscopy or flexible sigmoidoscopy):  Excessive amounts of blood in the stool  Significant tenderness or worsening of abdominal pains  Swelling of the abdomen that is new, acute  Fever of 100F or higher  FOLLOW UP: If any biopsies were taken you will be contacted by phone or by letter within the next 1-3 weeks.  Call your gastroenterologist if you have not heard about the biopsies in 3 weeks.  Our staff will call the home number listed on your records the next business day following your procedure to check on you and address any questions or concerns that you may have at that time regarding the information given to you following your procedure. This is a courtesy call and so if there is no answer at the home number and we have not heard from you through the emergency physician on call, we will assume that you have returned to your regular daily activities without incident.  SIGNATURES/CONFIDENTIALITY: You and/or your care partner have signed paperwork which will be entered into your electronic medical record.  These signatures attest to the fact that that the information above on your After Visit Summary has been reviewed and is understood.  Full responsibility of the confidentiality of this   discharge information lies with you and/or your care-partner.  Resume medications. 

## 2013-04-16 NOTE — Op Note (Signed)
[facility] [address] [place], [id number]   COLONOSCOPY PROCEDURE REPORT  PATIENT: Angela Chapman, Angela Chapman  MR#: [id number] BIRTHDATE: 1950-07-13 , 62  yrs. old GENDER: Female ENDOSCOPIST: Rachael Feeaniel P Jacobs, MD PROCEDURE DATE:  04/16/2013 PROCEDURE:   Colonoscopy, screening First Screening Colonoscopy - Avg.  risk and is 50 yrs.  old or older - No.  Prior Negative Screening - Now for repeat screening. Inadequate prep  History of Adenoma - Now for follow-up colonoscopy & has been > or = to 3 yrs.  N/A  Polyps Removed Today? No. Recommend repeat exam, <10 yrs? No. ASA CLASS:   Class II INDICATIONS:[place] found two small HPs, prep was "fair". MEDICATIONS: propofol (Diprivan) 200mg  IV and MAC sedation, administered by CRNA  DESCRIPTION OF PROCEDURE:   After the risks benefits and alternatives of the procedure were thoroughly explained, informed consent was obtained.  A digital rectal exam revealed no abnormalities of the rectum.   The LB [id number]  endoscope was introduced through the anus and advanced to the cecum, which was identified by both the appendix and ileocecal valve. No adverse events experienced.   The quality of the prep was excellent.  The instrument was then slowly withdrawn as the colon was fully examined.   COLON FINDINGS: A normal appearing cecum, ileocecal valve, and appendiceal orifice were identified.  The ascending, hepatic flexure, transverse, splenic flexure, descending, sigmoid colon and rectum appeared unremarkable.  No polyps or cancers were seen. Retroflexed views revealed no abnormalities. The time to cecum=2 minutes 50 seconds.  Withdrawal time=7 minutes 40 seconds.  The scope was withdrawn and the procedure completed. COMPLICATIONS: There were no complications.  ENDOSCOPIC IMPRESSION: Normal colon No polyps or cancers  RECOMMENDATIONS: You should continue to follow colorectal cancer screening  guidelines for "routine risk" patients with a repeat colonoscopy in 10 years.    eSigned:  Rachael Feeaniel P Jacobs, MD 04/16/2013 8:55 AM   cc: Kerby NoraAmy Bedsole, MD

## 2013-04-18 ENCOUNTER — Telehealth: Payer: Self-pay | Admitting: *Deleted

## 2013-04-18 NOTE — Telephone Encounter (Signed)
  Follow up Call-  Call back number 04/16/2013  Post procedure Call Back phone  # [phone]  Permission to leave phone message Yes     Patient questions:  Do you have a fever, pain , or abdominal swelling? no Pain Score  0 *  Have you tolerated food without any problems? yes  Have you been able to return to your normal activities? yes  Do you have any questions about your discharge instructions: Diet   no Medications  no Follow up visit  no  Do you have questions or concerns about your Care? no  Actions: * If pain score is 4 or above: No action needed, pain <4.

## 2013-08-13 ENCOUNTER — Encounter: Payer: Self-pay | Admitting: Family Medicine

## 2013-08-13 ENCOUNTER — Ambulatory Visit (INDEPENDENT_AMBULATORY_CARE_PROVIDER_SITE_OTHER): Payer: BC Managed Care – PPO | Admitting: Family Medicine

## 2013-08-13 VITALS — BP 118/70 | HR 74 | Temp 97.9°F | Ht 64.5 in | Wt 172.8 lb

## 2013-08-13 DIAGNOSIS — R599 Enlarged lymph nodes, unspecified: Secondary | ICD-10-CM

## 2013-08-13 NOTE — Progress Notes (Signed)
[address] [place] [address] Phone: [phone] Fax: [phone]  Patient ID: Angela Chapman MRN: [id number], DOB: 1950/06/21, 63 y.o. Date of Encounter: 08/13/2013  Primary Physician:  Kerby NoraAmy Bedsole, MD   Chief Complaint: Adenopathy and Check Areas on Feet   Subjective:   History of Present Illness:  Angela Cottonancy Godina is a 63 y.o. very pleasant female patient who presents with the following:  Lymph nodes swollen and swollen on the back of her head. Felt some pain in the back of her sinuses and stopped taking her zyrtec and got some kind of insect bite. Another reaction - worried would get worse again.   10/2013 - bad, insect bite  ? Navicular bones.   Past Medical History, Surgical History, Social History, Family History, Problem List, Medications, and Allergies have been reviewed and updated if relevant.  Review of Systems:  GEN: No acute illnesses, no fevers, chills. GI: No n/v/d, eating normally Pulm: No SOB Interactive and getting along well at home.  Otherwise, ROS is as per the HPI.  Objective:   Physical Examination: BP 118/70  Pulse 74  Temp(Src) 97.9 F (36.6 C) (Oral)  Ht 5' 4.5" (1.638 m)  Wt 172 lb 12 oz (78.359 kg)  BMI 29.21 kg/m2   GEN: WDWN, NAD, Non-toxic, A & O x 3 HEENT: Atraumatic, Normocephalic. Neck supple. No masses, No LAD. Ears and Nose: No external deformity. CV: RRR, No M/G/R. No JVD. No thrill. No extra heart sounds. PULM: CTA B, no wheezes, crackles, rhonchi. No retractions. No resp. distress. No accessory muscle use. EXTR: No c/c/e NEURO Normal gait.  PSYCH: Normally interactive. Conversant. Not depressed or anxious appearing.  Calm demeanor.   Laboratory and Imaging Data:  Assessment & Plan:   Glands swollen  I am not concerned. At this point it appears that her lymph nodes are normal. The sounds that this was more of a normal reaction, and she does not have any kind of persistent infection now. I reassured her. Anything  worsens or seems as if his cellulitis, asked her to call office  New Prescriptions   No medications on file   Modified Medications   Modified Medication Previous Medication   CYCLOBENZAPRINE (FLEXERIL) 10 MG TABLET cyclobenzaprine (FLEXERIL) 10 MG tablet      Take 10 mg by mouth at bedtime as needed.    Take 1 tablet (10 mg total) by mouth at bedtime.   MELOXICAM (MOBIC) 15 MG TABLET meloxicam (MOBIC) 15 MG tablet      Take 15 mg by mouth daily as needed.    Take 1 tablet (15 mg total) by mouth daily.   TRIAMCINOLONE CREAM (KENALOG) 0.1 % triamcinolone cream (KENALOG) 0.1 %      Apply 1 application topically 2 (two) times daily as needed.    Apply 1 application topically 2 (two) times daily.   No orders of the defined types were placed in this encounter.   Follow-up: No Follow-up on file. Unless noted above, the patient is to follow-up if symptoms worsen. Red flags were reviewed with the patient.  Signed,  Elpidio GaleaSpencer T. Copland, MD, CAQ Sports Medicine   Discontinued Medications   MONTELUKAST (SINGULAIR) 10 MG TABLET    Take 1 tablet (10 mg total) by mouth daily.   Current Medications at Discharge:   Medication List       This list is accurate as of: 08/13/13 11:59 PM.  Always use your most recent med list.  acetaminophen 325 MG tablet  Commonly known as:  TYLENOL  Take 650 mg by mouth every 6 (six) hours as needed.     amitriptyline 10 MG tablet  Commonly known as:  ELAVIL  Take 10 mg by mouth at bedtime as needed.     CALCIUM 600+D 600-400 MG-UNIT per tablet  Generic drug:  Calcium Carbonate-Vitamin D  Take 1 tablet by mouth 2 (two) times daily.     cetirizine 10 MG tablet  Commonly known as:  ZYRTEC  Take 10 mg by mouth daily.     cyclobenzaprine 10 MG tablet  Commonly known as:  FLEXERIL  Take 10 mg by mouth at bedtime as needed.     famotidine 20 MG tablet  Commonly known as:  PEPCID  Take 20 mg by mouth 2 (two) times daily as needed.      fluticasone 50 MCG/ACT nasal spray  Commonly known as:  FLONASE  Place into both nostrils 2 (two) times daily.     GAVISCON PO  Take by mouth as needed.     meloxicam 15 MG tablet  Commonly known as:  MOBIC  Take 15 mg by mouth daily as needed.     triamcinolone cream 0.1 %  Commonly known as:  KENALOG  Apply 1 application topically 2 (two) times daily as needed.     valACYclovir 1000 MG tablet  Commonly known as:  VALTREX  Take 1 tablet (1,000 mg total) by mouth as needed.

## 2013-08-13 NOTE — Progress Notes (Signed)
Pre visit review using our clinic review tool, if applicable. No additional management support is needed unless otherwise documented below in the visit note. 

## 2014-01-18 IMAGING — MG MM CAD SCREENING MAMMO
1 series · 4 of 4 positions shown · non-contrast
Comparison: none

REASON FOR EXAM: SCR MAMMO NO ORDER
COMMENTS:

PROCEDURE:     MAM - MAM DGTL SCRN MAM NO ORDER W/CAD  - October 12, 2011 [DATE]
RESULT:     COMPARISON:  06/08/2009, 06/04/2008
TECHNIQUE: Digital screening mammograms were obtained. FDA approved
computer-aided detection (CAD) for mammography was utilized for this study.

[R CC · right · 4 of 4 slices shown]
[im 1/4]
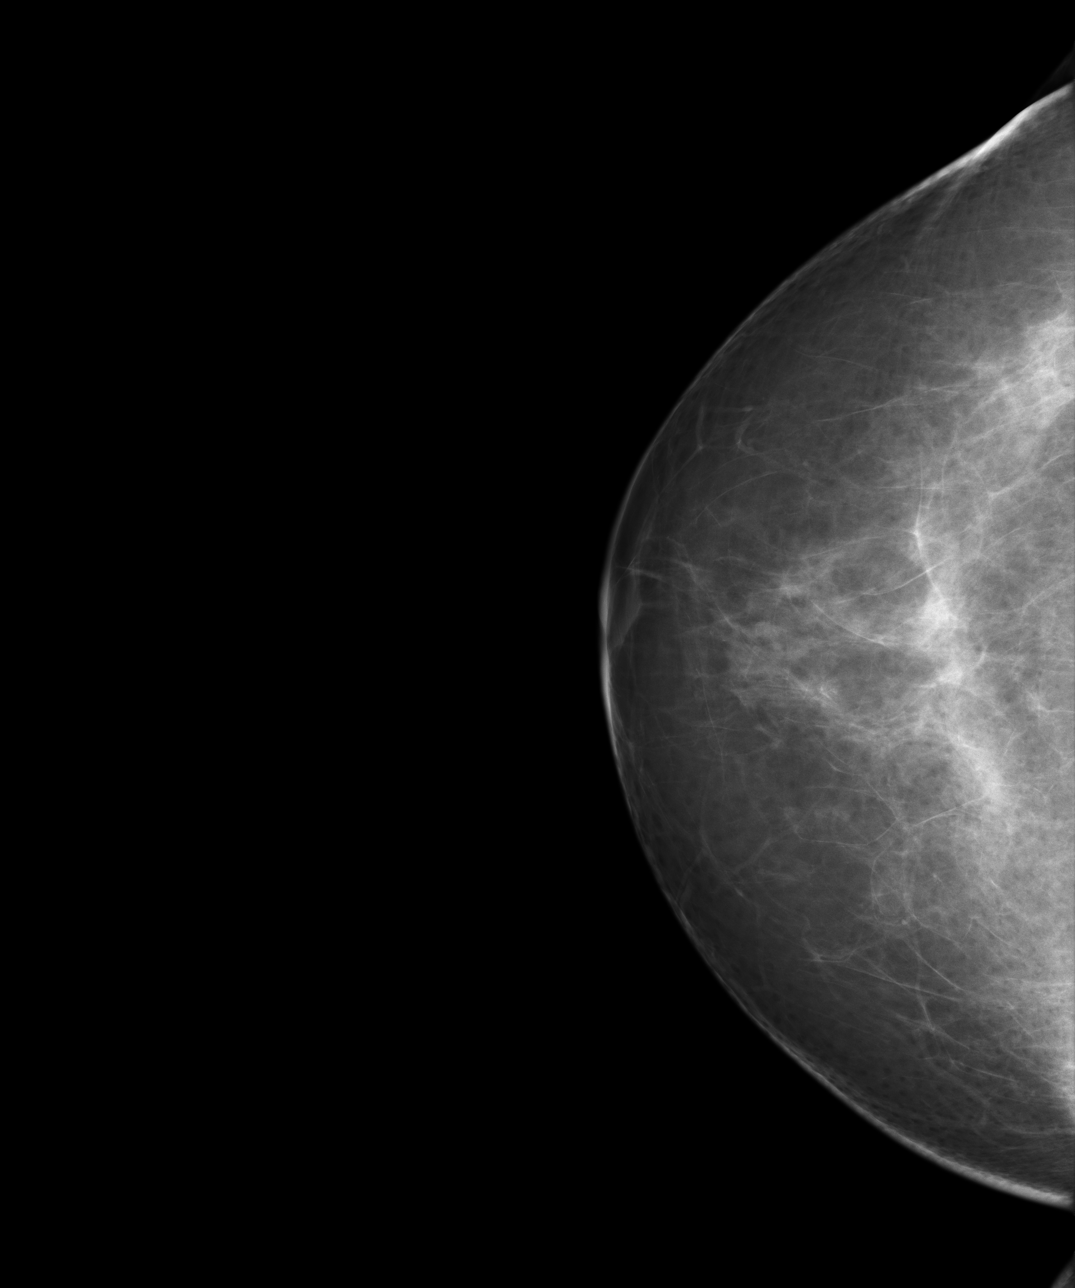
[im 2/4]
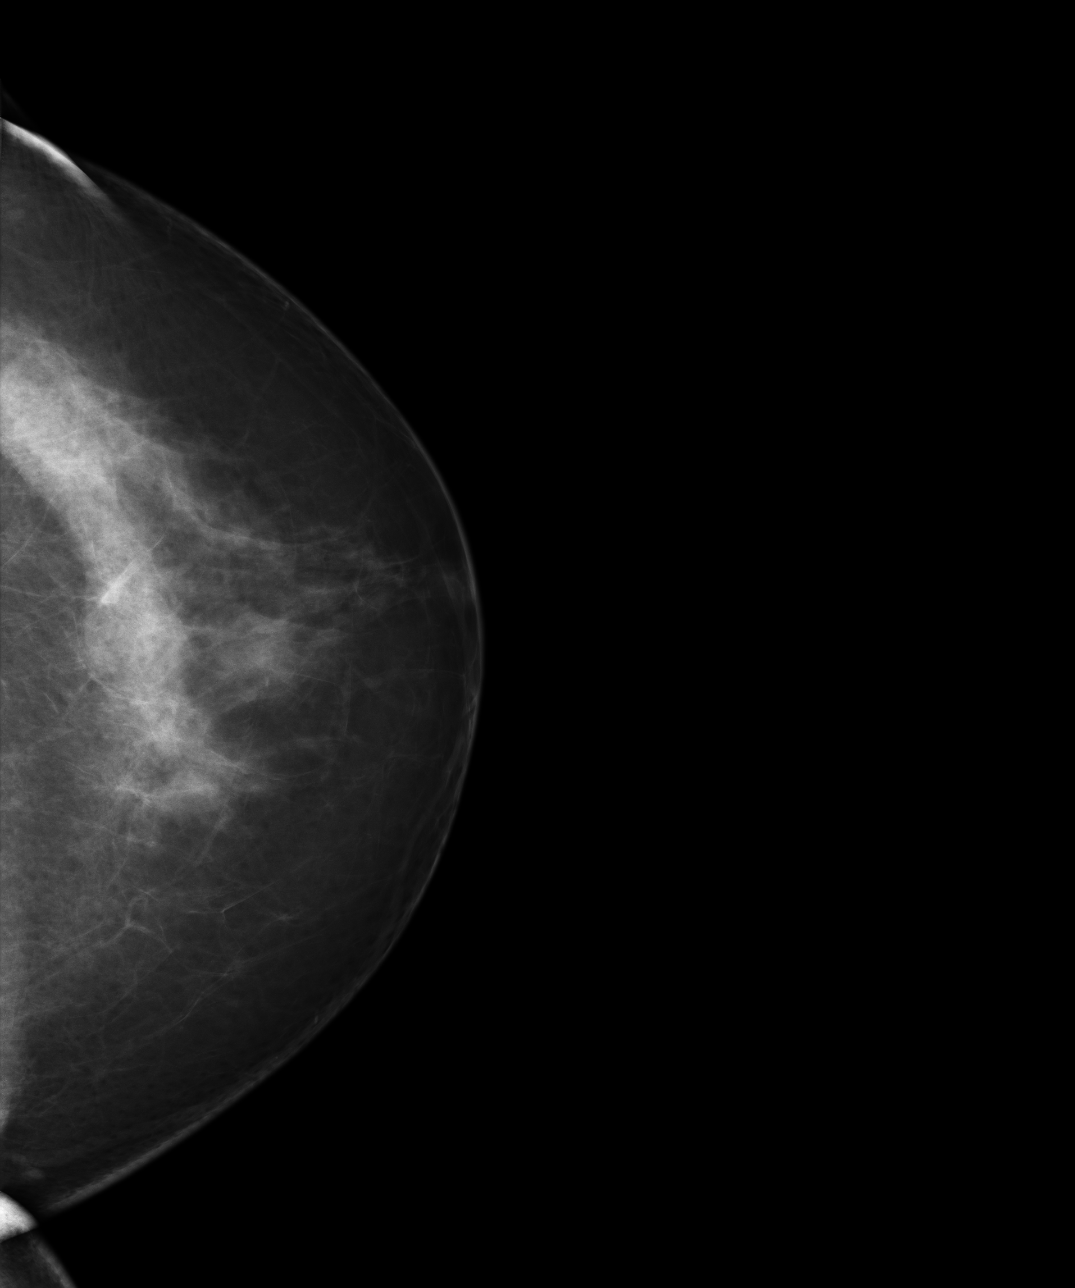
[im 3/4]
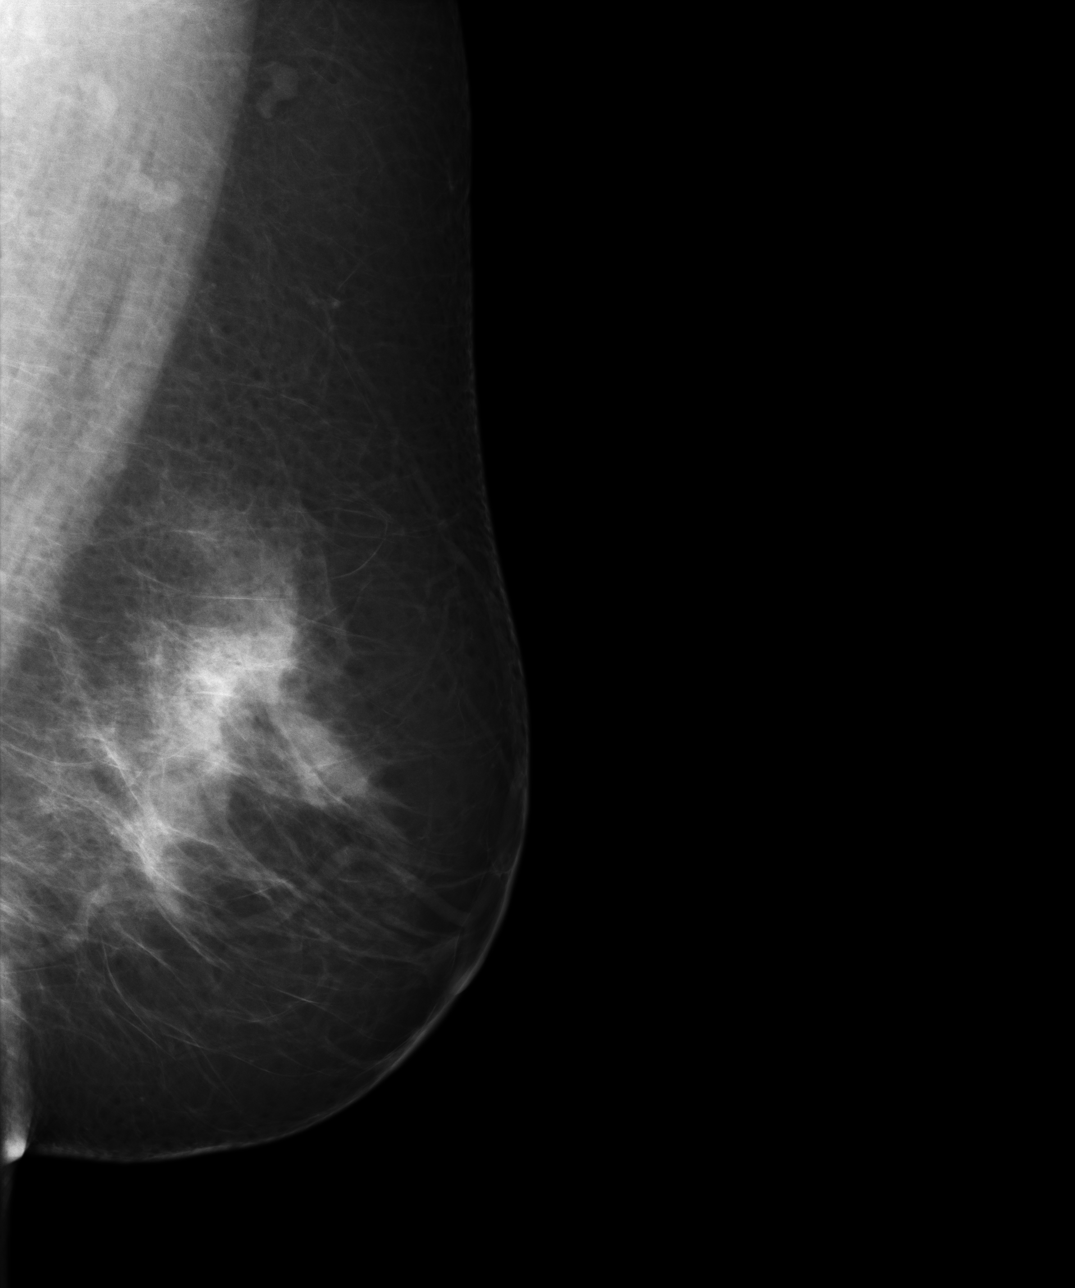
[im 4/4]
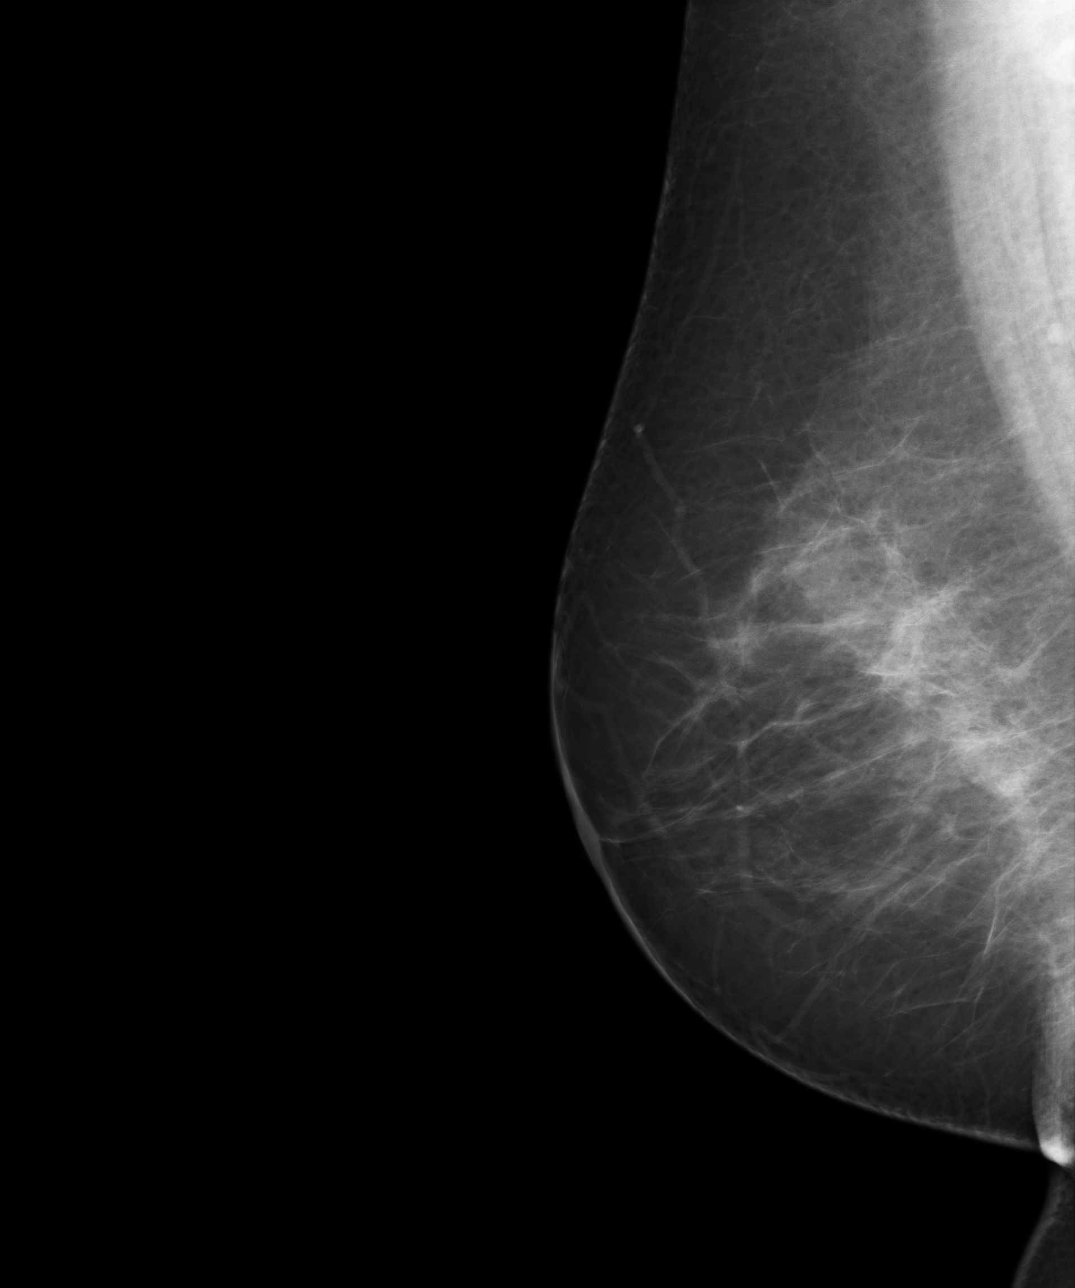

[4 of 4 positions shown; findings below may reference images not displayed]

FINDING: Bilateral breasts demonstrate a scattered fibroglandular density. There is
no dominant mass, architectural distortion or clusters of suspicious
microcalcifications.
IMPRESSION: 1.     Stable bilateral mammogram.
2.     Annual mammographic follow up recommended.
3.     BI-RADS:  Category 2- Benign.

A negative mammogram report does not preclude biopsy or other evaluation of
a clinically palpable or otherwise suspicious mass or lesion. Breast cancer
may not be detected by mammography in up to 10% of cases.

[REDACTED]

## 2014-02-11 ENCOUNTER — Telehealth: Payer: Self-pay

## 2014-02-11 NOTE — Telephone Encounter (Signed)
PLEASE NOTE: All timestamps contained within this report are represented as [place] Standard Time. CONFIDENTIALTY NOTICE: This fax transmission is intended only for the addressee. It contains information that is legally privileged, confidential or otherwise protected from use or disclosure. If you are not the intended recipient, you are strictly prohibited from reviewing, disclosing, copying using or disseminating any of this information or taking any action in reliance on or regarding this information. If you have received this fax in error, please notify us immediately by telephone so that we can arrange for its return to us. Phone: [phone], Toll-Free: [phone], Fax: [phone] Page: 1 of 2 Call Id: [id number] [facility] - Client TELEPHONE ADVICE RECORD [facility] Patient Name: Angela CottonANCY Casali Gender: Female DOB: 1951-01-02 Age: 63 Y 2 M 25 D Return Phone Number: [phone] (Primary) Address: [place]: [place] [address] Client [facility] - Client Client Site [facility] Day Physician Copland, Spencer Contact Type Call Call Type Triage / Clinical Relationship To Patient Self Return Phone Number [phone] (Primary) Chief Complaint Nasal Congestion Initial Comment Caller states that she has a sinus infection PreDisposition Call Doctor Nurse Assessment Nurse: Elijah Birkaldwell, RN, Stark BrayLynda Date/Time Lamount Cohen(Eastern Time): 02/11/2014 2:05:38 PM Confirm and document reason for call. If symptomatic, describe symptoms. ---Caller states that she has a sinus infection, has been going on for awhile now - green phlegm with cough. No fever. Her throat feels tight. Has the patient traveled out of the country within the last 30 days? ---Not Applicable Does the patient require triage? ---Yes Related visit to physician within the last 2 weeks? ---No Does the PT have any chronic conditions? (i.e.  diabetes, asthma, etc.) ---Yes List chronic conditions. ---allergies Guidelines Guideline Title Affirmed Question Affirmed Notes Nurse Date/Time Lamount Cohen(Eastern Time) Sinus Pain or Congestion Lots of coughing Allyson SabalCaldwell, RN, Lynda 02/11/2014 2:07:53 PM Disp. Time Lamount Cohen(Eastern Time) Disposition Final User 02/11/2014 1:17:24 PM Send To Clinical Follow Up Denice BorsQueue Beeler, Carolyn 02/11/2014 1:40:13 PM Attempt made - message left Elijah BirkCaldwell, RN, Lynda 02/11/2014 1:53:43 PM Attempt made - message left Elijah BirkCaldwell, RN, Stark BrayLynda 02/11/2014 2:14:36 PM See PCP When Office is Open (within 3 days) Yes Elijah Birkaldwell, RN, Irene ShipperLynda Caller Understands: Yes Disagree/Comply: Comply PLEASE NOTE: All timestamps contained within this report are represented as [place] Standard Time. CONFIDENTIALTY NOTICE: This fax transmission is intended only for the addressee. It contains information that is legally privileged, confidential or otherwise protected from use or disclosure. If you are not the intended recipient, you are strictly prohibited from reviewing, disclosing, copying using or disseminating any of this information or taking any action in reliance on or regarding this information. If you have received this fax in error, please notify us immediately by telephone so that we can arrange for its return to us. Phone: [phone], Toll-Free: [phone], Fax: [phone] Page: 2 of 2 Call Id: [id number] Care Advice Given Per Guideline SEE PCP WITHIN 3 DAYS: You need to be examined within 2 or 3 days. Call your doctor during regular office hours and make an appointment. (Note: if office will be open tomorrow, tell caller to call then, not in 3 days). COUGH MEDICINES: * HOME REMEDY - HONEY: This old home remedy has been shown to help decrease coughing at night. The adult dosage is 2 teaspoons (10 ml) at bedtime. Honey should not be given to infants under one year of age. OTC COUGH SYRUP - DEXTROMETHORPHAN: FOR A STUFFY NOSE - USE NASAL  WASHES: *  Introduction: Saline (salt water) nasal irrigation (nasal wash) is an effective and simple home remedy for treating stuffy nose and sinus congestion. The nose can be irrigated by pouring, spraying, or squirting salt water into the nose and then letting it run back out. * How it Helps: The salt water rinses out excess mucus, washes out any irritants (dust, allergens) that might be present, and moistens the nasal cavity. PAIN MEDICINES: * For pain relief, take acetaminophen, ibuprofen, or naproxen. * Use the lowest amount that makes your pain feel better. CALL BACK IF: * Difficulty breathing (and not relieved by cleaning out nose) * You become worse. CARE ADVICE given per Sinus Pain or Congestion (Adult) guideline. After Care Instructions Given Call Event Type User Date / Time Description Comments User: Lily LovingsLynda, Caldwell, RN Date/Time Lamount Cohen(Eastern Time): 02/11/2014 2:15:58 PM Caller states she has appt. for 9:15 am tomorrow, the soonest they can get her in to office. Advised that she should be seen at alternate facility if possible today, especially with tightness in throat d/t sinus congestion.

## 2014-02-11 NOTE — Telephone Encounter (Signed)
Pt has appt with Nicki Reaperegina Baity NP on 02/12/14 at 9:15 am.

## 2014-02-12 ENCOUNTER — Encounter: Payer: Self-pay | Admitting: Internal Medicine

## 2014-02-12 ENCOUNTER — Ambulatory Visit (INDEPENDENT_AMBULATORY_CARE_PROVIDER_SITE_OTHER): Payer: BC Managed Care – PPO | Admitting: Internal Medicine

## 2014-02-12 VITALS — BP 134/78 | HR 91 | Temp 97.7°F | Wt 182.0 lb

## 2014-02-12 DIAGNOSIS — J069 Acute upper respiratory infection, unspecified: Secondary | ICD-10-CM

## 2014-02-12 MED ORDER — AMOXICILLIN 875 MG PO TABS: 875.0000 mg | ORAL_TABLET | Freq: Two times a day (BID) | ORAL | Status: DC

## 2014-02-12 MED ORDER — HYDROCODONE-HOMATROPINE 5-1.5 MG/5ML PO SYRP: 5.0000 mL | ORAL_SOLUTION | Freq: Three times a day (TID) | ORAL | Status: DC | PRN

## 2014-02-12 NOTE — Progress Notes (Signed)
Pre visit review using our clinic review tool, if applicable. No additional management support is needed unless otherwise documented below in the visit note. 

## 2014-02-12 NOTE — Patient Instructions (Signed)
Upper Respiratory Infection, Adult An upper respiratory infection (URI) is also sometimes known as the common cold. The upper respiratory tract includes the nose, sinuses, throat, trachea, and bronchi. Bronchi are the airways leading to the lungs. Most people improve within 1 week, but symptoms can last up to 2 weeks. A residual cough may last even longer.  CAUSES Many different viruses can infect the tissues lining the upper respiratory tract. The tissues become irritated and inflamed and often become very moist. Mucus production is also common. A cold is contagious. You can easily spread the virus to others by oral contact. This includes kissing, sharing a glass, coughing, or sneezing. Touching your mouth or nose and then touching a surface, which is then touched by another person, can also spread the virus. SYMPTOMS  Symptoms typically develop 1 to 3 days after you come in contact with a cold virus. Symptoms vary from person to person. They may include:  Runny nose.  Sneezing.  Nasal congestion.  Sinus irritation.  Sore throat.  Loss of voice (laryngitis).  Cough.  Fatigue.  Muscle aches.  Loss of appetite.  Headache.  Low-grade fever. DIAGNOSIS  You might diagnose your own cold based on familiar symptoms, since most people get a cold 2 to 3 times a year. Your caregiver can confirm this based on your exam. Most importantly, your caregiver can check that your symptoms are not due to another disease such as strep throat, sinusitis, pneumonia, asthma, or epiglottitis. Blood tests, throat tests, and X-rays are not necessary to diagnose a common cold, but they may sometimes be helpful in excluding other more serious diseases. Your caregiver will decide if any further tests are required. RISKS AND COMPLICATIONS  You may be at risk for a more severe case of the common cold if you smoke cigarettes, have chronic heart disease (such as heart failure) or lung disease (such as asthma), or if  you have a weakened immune system. The very young and very old are also at risk for more serious infections. Bacterial sinusitis, middle ear infections, and bacterial pneumonia can complicate the common cold. The common cold can worsen asthma and chronic obstructive pulmonary disease (COPD). Sometimes, these complications can require emergency medical care and may be life-threatening. PREVENTION  The best way to protect against getting a cold is to practice good hygiene. Avoid oral or hand contact with people with cold symptoms. Wash your hands often if contact occurs. There is no clear evidence that vitamin C, vitamin E, echinacea, or exercise reduces the chance of developing a cold. However, it is always recommended to get plenty of rest and practice good nutrition. TREATMENT  Treatment is directed at relieving symptoms. There is no cure. Antibiotics are not effective, because the infection is caused by a virus, not by bacteria. Treatment may include:  Increased fluid intake. Sports drinks offer valuable electrolytes, sugars, and fluids.  Breathing heated mist or steam (vaporizer or shower).  Eating chicken soup or other clear broths, and maintaining good nutrition.  Getting plenty of rest.  Using gargles or lozenges for comfort.  Controlling fevers with ibuprofen or acetaminophen as directed by your caregiver.  Increasing usage of your inhaler if you have asthma. Zinc gel and zinc lozenges, taken in the first 24 hours of the common cold, can shorten the duration and lessen the severity of symptoms. Pain medicines may help with fever, muscle aches, and throat pain. A variety of non-prescription medicines are available to treat congestion and runny nose. Your caregiver   can make recommendations and may suggest nasal or lung inhalers for other symptoms.  HOME CARE INSTRUCTIONS   Only take over-the-counter or prescription medicines for pain, discomfort, or fever as directed by your  caregiver.  Use a warm mist humidifier or inhale steam from a shower to increase air moisture. This may keep secretions moist and make it easier to breathe.  Drink enough water and fluids to keep your urine clear or pale yellow.  Rest as needed.  Return to work when your temperature has returned to normal or as your caregiver advises. You may need to stay home longer to avoid infecting others. You can also use a face mask and careful hand washing to prevent spread of the virus. SEEK MEDICAL CARE IF:   After the first few days, you feel you are getting worse rather than better.  You need your caregiver's advice about medicines to control symptoms.  You develop chills, worsening shortness of breath, or brown or red sputum. These may be signs of pneumonia.  You develop yellow or brown nasal discharge or pain in the face, especially when you bend forward. These may be signs of sinusitis.  You develop a fever, swollen neck glands, pain with swallowing, or white areas in the back of your throat. These may be signs of strep throat. SEEK IMMEDIATE MEDICAL CARE IF:   You have a fever.  You develop severe or persistent headache, ear pain, sinus pain, or chest pain.  You develop wheezing, a prolonged cough, cough up blood, or have a change in your usual mucus (if you have chronic lung disease).  You develop sore muscles or a stiff neck. Document Released: 08/02/2000 Document Revised: 05/01/2011 Document Reviewed: 05/14/2013 Exit[employer] Patient Information 2015 [employer]. This information is not intended to replace advice given to you by your health care provider. Make sure you discuss any questions you have with your health care provider.  

## 2014-02-12 NOTE — Progress Notes (Signed)
HPI  Pt presents to the clinic today with c/o cough, chest congestion, shortness of breath. She reports this started 2-3 days ago. The cough is productive of green mucous. She has had some associated ear fullness. She denies fever but has had chills and body aches. She did have cold symptoms for 1 week prior to these symptoms. She does have a history of allergies. She has had sick contacts.  Review of Systems      Past Medical History  Diagnosis Date  . Allergy   . Fibromyalgia   . Blood transfusion without reported diagnosis   . Depression     no per pt  . GERD (gastroesophageal reflux disease)   . Broken ribs     fell and broke several ribs 12-23-12    Family History  Problem Relation Age of Onset  . Arthritis Mother   . Cancer Mother     breast  . Kidney disease Mother   . Diabetes Mother   . Stroke Father   . Colon cancer Neg Hx   . Esophageal cancer Neg Hx   . Rectal cancer Neg Hx   . Stomach cancer Neg Hx     History   Social History  . Marital Status: Married    Spouse Name: N/A    Number of Children: N/A  . Years of Education: N/A   Occupational History  . 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 Smoking status: Never Smoker   . Smokeless tobacco: Never Used  . Alcohol Use: No  . Drug Use: No  . Sexual Activity: Not on file   Other Topics Concern  . Not on file   Social History Narrative   Regular exercise yes 3 time weekly          Allergies  Allergen Reactions  . Oxycodone-Acetaminophen     REACTION: rash  . Sulfonamide Derivatives     REACTION: Swelling     Constitutional: Positive headache, fatigue. Denies fever or abrupt weight changes.  HEENT:  Positive ear pressure, nasal congestion, sore throat. Denies eye redness, eye pain, pressure behind the eyes, facial pain, ear pain, ringing in the ears, wax buildup, runny nose or bloody nose. Respiratory: Positive cough. Denies difficulty breathing or shortness of breath.   Cardiovascular: Denies chest pain, chest tightness, palpitations or swelling in the hands or feet.   No other specific complaints in a complete review of systems (except as listed in HPI above).  Objective:   BP 134/78 mmHg  Pulse 91  Temp(Src) 97.7 F (36.5 C) (Oral)  Wt 182 lb (82.555 kg)  SpO2 98% Wt Readings from Last 3 Encounters:  02/12/14 182 lb (82.555 kg)  08/13/13 172 lb 12 oz (78.359 kg)  04/16/13 175 lb (79.379 kg)     General: Appears her stated age, ill appearing in NAD. HEENT: Head: normal shape and size, no sinus tenderness noted; Eyes: sclera white, no icterus; Ears: Tm's gray and intact, normal light reflex; Nose: mucosa pink and moist, septum midline; Throat/Mouth:Teeth present, mucosa erythematous and moist, + PND, no exudate noted, no lesions or ulcerations noted.  Neck: No cervical lymphadenopathy.   Cardiovascular: Normal rate and rhythm. S1,S2 noted.  No murmur, rubs or gallops noted.  Pulmonary/Chest: Normal effort and positive vesicular breath sounds. No respiratory distress. No wheezes, rales or ronchi noted.      Assessment & Plan:   Upper Respiratory Infection with Cough:  Get some rest and drink plenty  of water Try some Mucinex OTC Do salt water gargle/Ibuprofen for the sore throat eRx for Amoxil BID x 10 days (axithromax upsets her stomach) eRx for Hycodan cough syrup  RTC as needed or if symptoms persist.

## 2014-03-27 ENCOUNTER — Ambulatory Visit: Payer: Self-pay | Admitting: Family Medicine

## 2014-03-30 ENCOUNTER — Encounter: Payer: Self-pay | Admitting: Family Medicine

## 2014-03-31 ENCOUNTER — Ambulatory Visit: Payer: Self-pay | Admitting: Family Medicine

## 2014-04-01 ENCOUNTER — Encounter: Payer: Self-pay | Admitting: Family Medicine

## 2015-01-12 ENCOUNTER — Encounter: Payer: Self-pay | Admitting: Primary Care

## 2015-01-12 ENCOUNTER — Ambulatory Visit (INDEPENDENT_AMBULATORY_CARE_PROVIDER_SITE_OTHER): Payer: BLUE CROSS/BLUE SHIELD | Admitting: Primary Care

## 2015-01-12 VITALS — BP 130/74 | HR 74 | Temp 97.7°F | Ht 64.5 in | Wt 176.0 lb

## 2015-01-12 DIAGNOSIS — H109 Unspecified conjunctivitis: Secondary | ICD-10-CM | POA: Diagnosis not present

## 2015-01-12 DIAGNOSIS — A499 Bacterial infection, unspecified: Secondary | ICD-10-CM

## 2015-01-12 DIAGNOSIS — J329 Chronic sinusitis, unspecified: Secondary | ICD-10-CM

## 2015-01-12 DIAGNOSIS — B9689 Other specified bacterial agents as the cause of diseases classified elsewhere: Secondary | ICD-10-CM

## 2015-01-12 MED ORDER — AMOXICILLIN-POT CLAVULANATE 875-125 MG PO TABS: 1.0000 | ORAL_TABLET | Freq: Two times a day (BID) | ORAL | Status: DC

## 2015-01-12 NOTE — Progress Notes (Signed)
Subjective:    Patient ID: Angela CottonNancy Chapman, female    DOB: 09-28-1950, 64 y.o.   MRN: [id number]  HPI  Angela Chapman is a 64 year old female who presents today with a chief complaint of redness to her right eye. She first noticed this yesterday when waking. Throughout the day she noticed a film over her eye with irritation. This morning she woke up with her right eye matted shut. She's noticed a yellow drainage throughout the morning.   She also reports nasal congestion, sinus pressure, ear aches, and cough that initially began 4 weeks ago. Her cough has been productive for the past 2 weeks with green sputum, and her sinus pressure has become worse (left frontal and maxillary). She's been taking sudafed, mucinex, zyrtec, flonase without relief of symptoms. She has a significant history of allergies, especially to cats. She visited her sister recently who has cats. Overall she's feeling worse and "cannot kick this on my own".  Review of Systems  Constitutional: Positive for chills. Negative for fever and fatigue.  HENT: Positive for congestion, ear pain, sinus pressure and sore throat.   Eyes: Positive for discharge, redness and visual disturbance. Negative for pain.  Respiratory: Positive for cough. Negative for shortness of breath.   Cardiovascular: Negative for chest pain.  Musculoskeletal: Negative for myalgias.  Neurological: Positive for headaches.       Past Medical History  Diagnosis Date  . Allergy   . Fibromyalgia   . Blood transfusion without reported diagnosis   . Depression     no per pt  . GERD (gastroesophageal reflux disease)   . Broken ribs     fell and broke several ribs 12-23-12    Social History   Social History  . Marital Status: Married    Spouse Name: N/A  . Number of Children: N/A  . Years of Education: N/A   Occupational History  . 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 Smoking status: Never Smoker   . Smokeless tobacco: Never Used  .  Alcohol Use: No  . Drug Use: No  . Sexual Activity: Not on file   Other Topics Concern  . Not on file   Social History Narrative   Regular exercise yes 3 time weekly          Past Surgical History  Procedure Laterality Date  . Abdominal hysterectomy    . Colonoscopy    . Wrist surgery      left    Family History  Problem Relation Age of Onset  . Arthritis Mother   . Cancer Mother     breast  . Kidney disease Mother   . Diabetes Mother   . Stroke Father   . Colon cancer Neg Hx   . Esophageal cancer Neg Hx   . Rectal cancer Neg Hx   . Stomach cancer Neg Hx     Allergies  Allergen Reactions  . Oxycodone-Acetaminophen     REACTION: rash  . Sulfonamide Derivatives     REACTION: Swelling    Current Outpatient Prescriptions on File Prior to Visit  Medication Sig Dispense Refill  . Alum Hydroxide-Mag Carbonate (GAVISCON PO) Take by mouth as needed.    Marland Kitchen. amitriptyline (ELAVIL) 10 MG tablet Take 10 mg by mouth at bedtime as needed.     . Calcium Carbonate-Vitamin D (CALCIUM 600+D) 600-400 MG-UNIT per tablet Take 1 tablet by mouth 2 (two) times daily.      .Marland Kitchen  cetirizine (ZYRTEC) 10 MG tablet Take 10 mg by mouth daily.    . famotidine (PEPCID) 20 MG tablet Take 20 mg by mouth 2 (two) times daily as needed.     . fluticasone (FLONASE) 50 MCG/ACT nasal spray Place into both nostrils 2 (two) times daily.    Marland Kitchen acetaminophen (TYLENOL) 325 MG tablet Take 650 mg by mouth every 6 (six) hours as needed.      . cyclobenzaprine (FLEXERIL) 10 MG tablet Take 10 mg by mouth at bedtime as needed.    . meloxicam (MOBIC) 15 MG tablet Take 15 mg by mouth daily as needed.    . triamcinolone cream (KENALOG) 0.1 % Apply 1 application topically 2 (two) times daily as needed.    . valACYclovir (VALTREX) 1000 MG tablet Take 1 tablet (1,000 mg total) by mouth as needed. (Patient not taking: Reported on 01/12/2015) 10 tablet 3   No current facility-administered medications on file prior to visit.      BP 130/74 mmHg  Pulse 74  Temp(Src) 97.7 F (36.5 C) (Oral)  Ht 5' 4.5" (1.638 m)  Wt 176 lb (79.833 kg)  BMI 29.75 kg/m2  SpO2 98%     Objective:   Physical Exam  Constitutional: She appears well-nourished.  HENT:  Right Ear: Tympanic membrane and ear canal normal.  Left Ear: Tympanic membrane and ear canal normal.  Nose: Right sinus exhibits no maxillary sinus tenderness and no frontal sinus tenderness. Left sinus exhibits maxillary sinus tenderness and frontal sinus tenderness.  Mouth/Throat: Oropharynx is clear and moist.  Eyes: Pupils are equal, round, and reactive to light. Right eye exhibits no discharge. Left eye exhibits no discharge. Right conjunctiva is injected. Right conjunctiva has no hemorrhage. Left conjunctiva is not injected.  Neck: Neck supple.  Cardiovascular: Normal rate and regular rhythm.   Pulmonary/Chest: Effort normal and breath sounds normal.  Lymphadenopathy:    She has no cervical adenopathy.  Skin: Skin is warm and dry.  Psychiatric: She has a normal mood and affect.          Assessment & Plan:  Conjunctivitis:  Injection with yellow drainage and film sensation to right eye x 2 days. Right eye matted shut this morning. No sick contacts, but does care for her small grandchild at times. Will treat with Augmentin that will also cover for concurrent sinusitis. Warm compresses, wash hands. Follow up PRN.  Acute Bacterial Sinusitis:  Sinus pressure, cough, nasal congestion x 4 weeks. Cough productive with green sputum x 2 weeks. Overall feeling worse. Exam with moderate tenderness to frontal and maxillary sinuses on left side. Lungs clear. RX for Augmentin BID x 10 days sent to pharmacy. Discussed to transition to plain allegra, use flonase, increase fluids, rest. Follow up PRN.

## 2015-01-12 NOTE — Patient Instructions (Signed)
Start Augmentin antibiotics for sinusitis and conjunctivitis. Take 1 tablet by mouth twice daily for 10 days.   Apply warm compresses to right eye four times daily.  Wash your hands often to prevent spread.  Increase consumption of fluids and rest.  It was a pleasure meeting you!

## 2015-01-12 NOTE — Progress Notes (Signed)
Pre visit review using our clinic review tool, if applicable. No additional management support is needed unless otherwise documented below in the visit note. 

## 2015-02-25 IMAGING — MG MM DIGITAL SCREENING BILAT W/ CAD
1 series · 4 of 4 positions shown · non-contrast
Comparison: Prior studies dating back to 06/04/2008

REASON FOR EXAM: SCR MAMMO NO ORDER
COMMENTS:

PROCEDURE:     MAM - MAM DGTL SCRN MAM NO ORDER W/CAD  - November 18, 2012 [DATE]
CLINICAL DATA: Screening.
DIGITAL SCREENING BILATERAL MAMMOGRAM WITH CAD

[R CC · right · 4 of 4 slices shown]
[im 1/4]
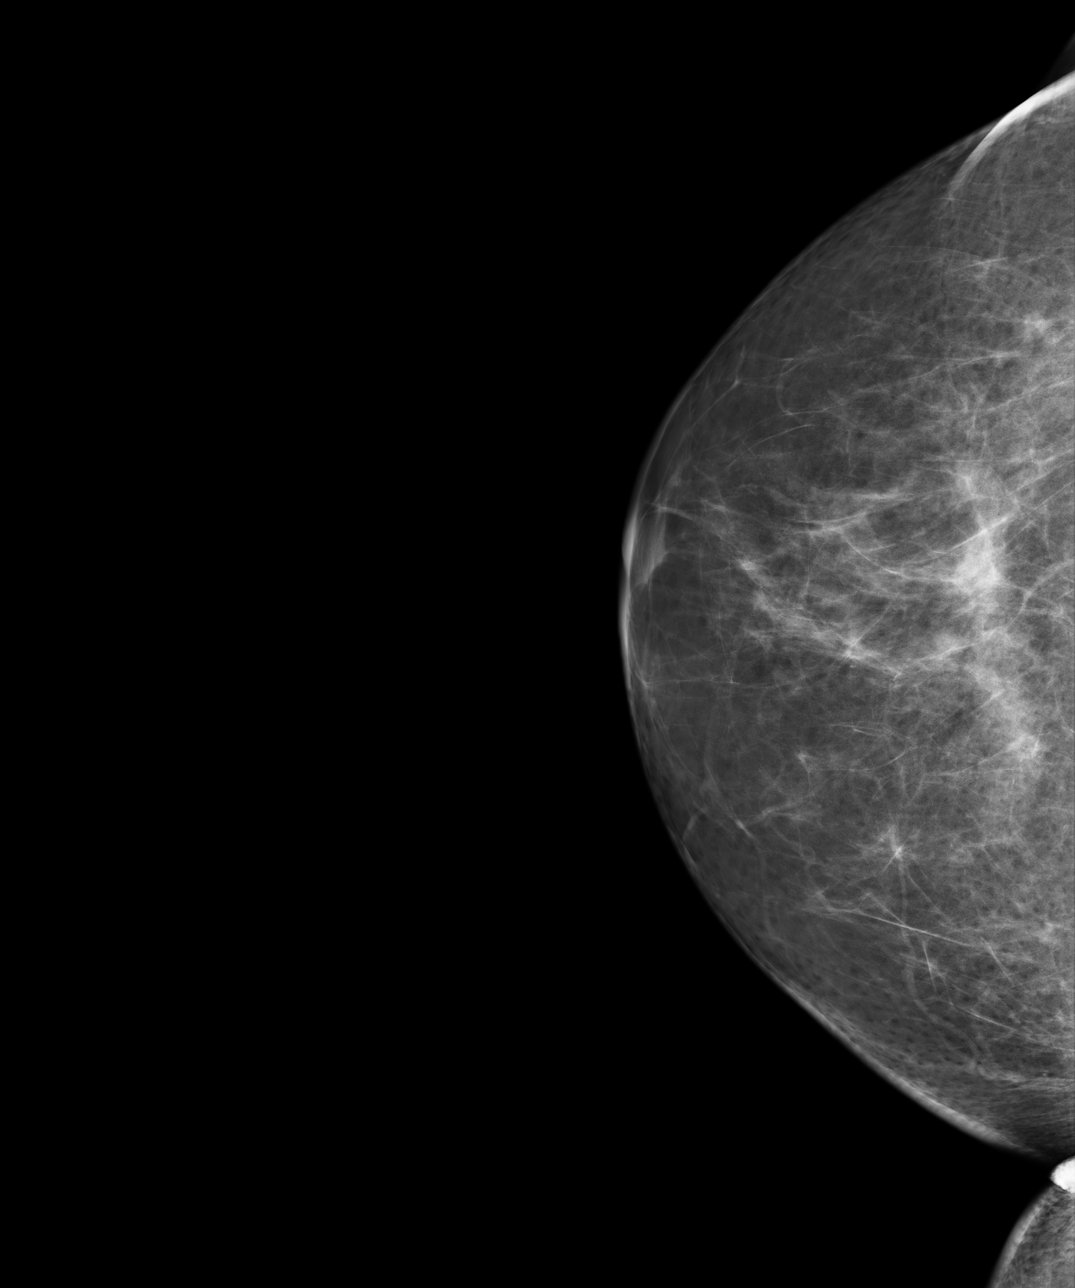
[im 2/4]
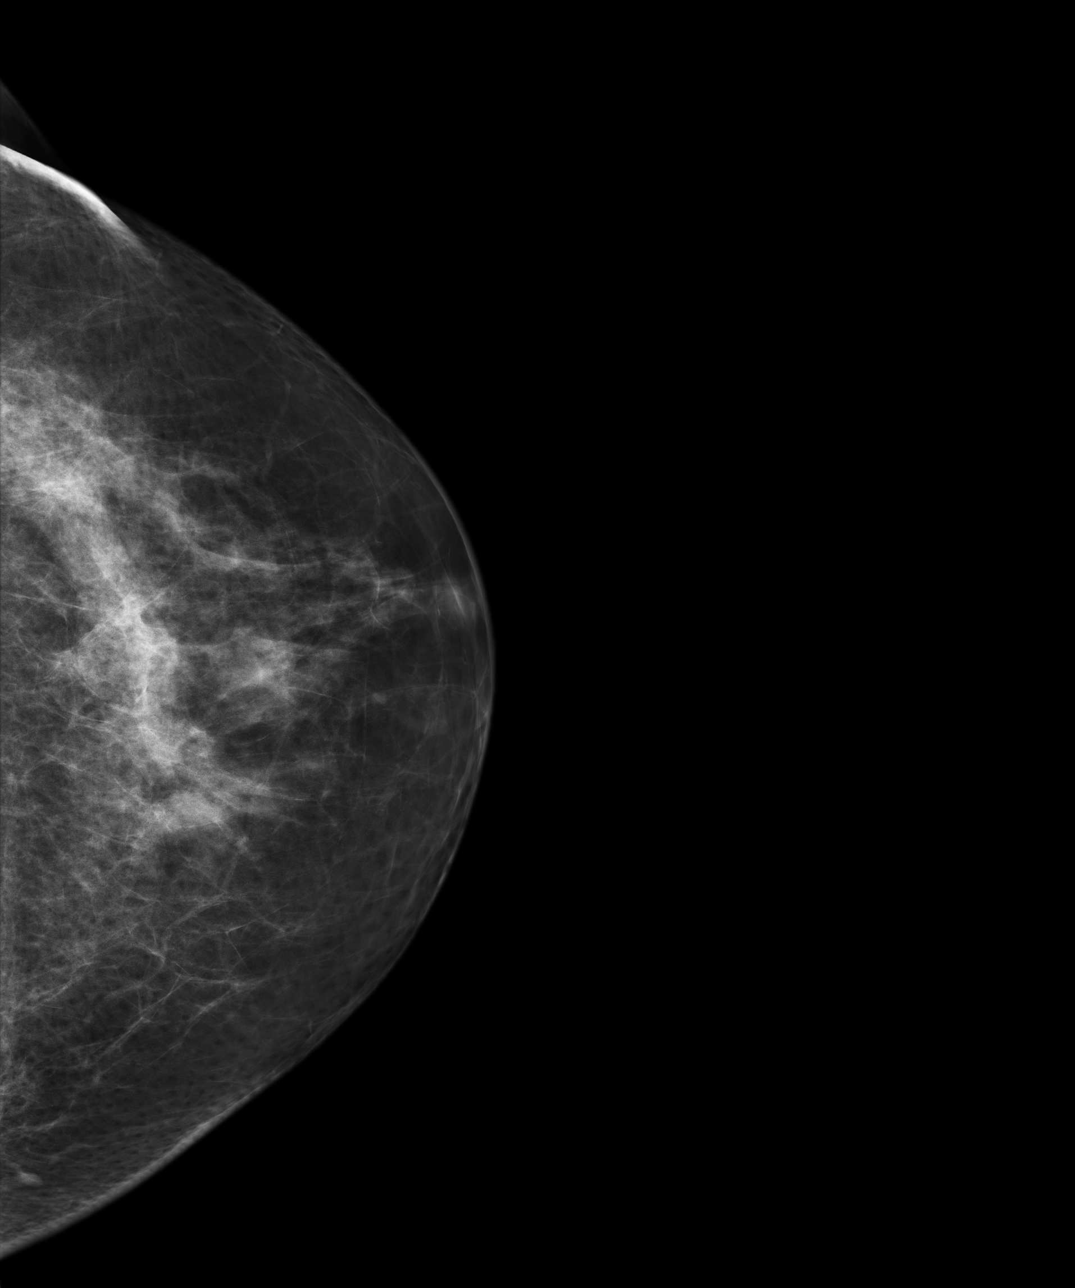
[im 3/4]
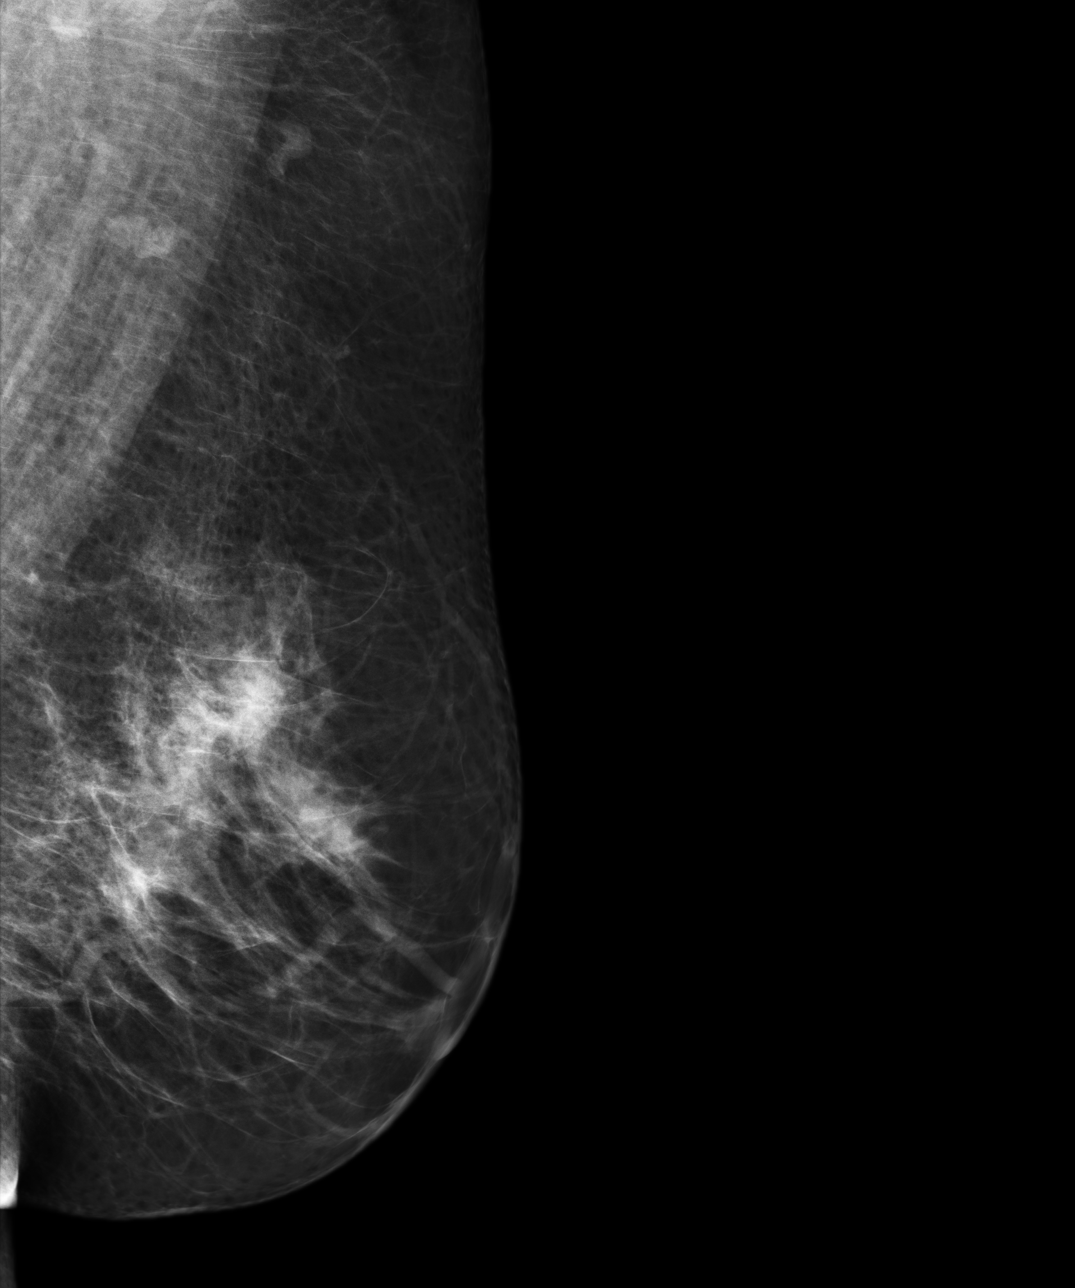
[im 4/4]
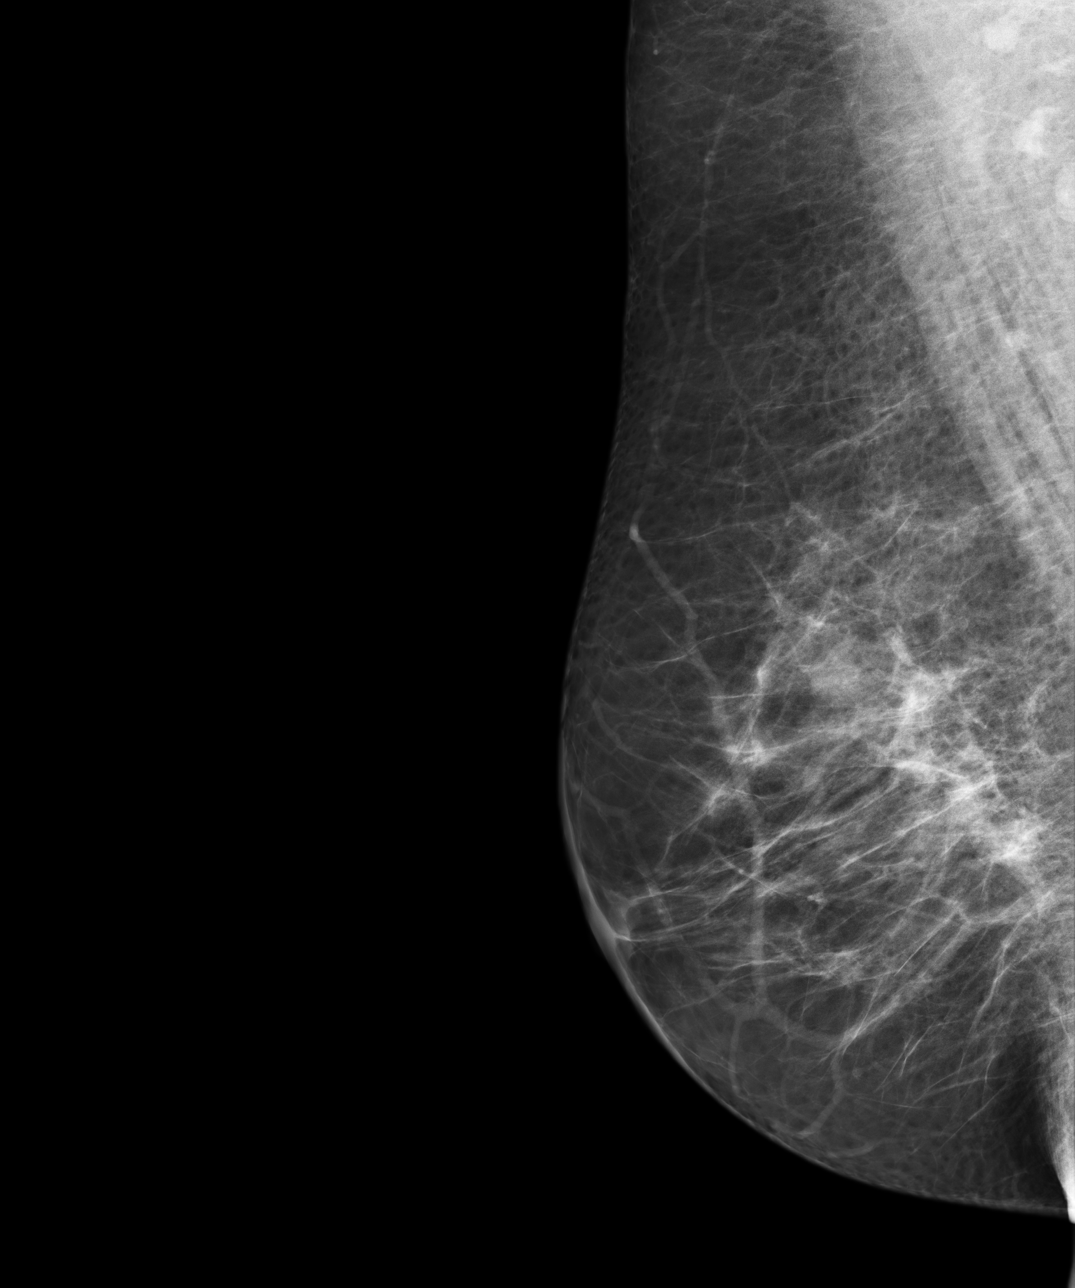

[4 of 4 positions shown; findings below may reference images not displayed]

FINDINGS: ACR Breast Density Category b: There are scattered areas of
fibroglandular density.

There is no suspicious dominant mass, architectural distortion, or
calcification to suggest malignancy.

Images were processed with CAD.
IMPRESSION: No mammographic evidence of malignancy.

A result letter of this screening mammogram will be mailed directly
to the patient.

RECOMMENDATION:
Screening mammogram in one year. (Code:[id number]-C-1KN)

BI-RADS CATEGORY 1:  Negative.

## 2015-02-26 ENCOUNTER — Encounter: Payer: Self-pay | Admitting: Gastroenterology

## 2015-04-12 ENCOUNTER — Ambulatory Visit (INDEPENDENT_AMBULATORY_CARE_PROVIDER_SITE_OTHER): Payer: BLUE CROSS/BLUE SHIELD | Admitting: Family Medicine

## 2015-04-12 ENCOUNTER — Encounter: Payer: Self-pay | Admitting: Family Medicine

## 2015-04-12 VITALS — BP 120/70 | HR 86 | Temp 97.8°F | Ht 64.5 in | Wt 174.5 lb

## 2015-04-12 DIAGNOSIS — J189 Pneumonia, unspecified organism: Secondary | ICD-10-CM | POA: Diagnosis not present

## 2015-04-12 DIAGNOSIS — J301 Allergic rhinitis due to pollen: Secondary | ICD-10-CM | POA: Diagnosis not present

## 2015-04-12 MED ORDER — FLUTICASONE PROPIONATE 50 MCG/ACT NA SUSP: 2.0000 | Freq: Every day | NASAL | Status: DC

## 2015-04-12 MED ORDER — DOXYCYCLINE HYCLATE 100 MG PO TABS: 100.0000 mg | ORAL_TABLET | Freq: Two times a day (BID) | ORAL | Status: DC

## 2015-04-12 NOTE — Progress Notes (Signed)
Dr. Karleen Hampshire T. Kenitra Leventhal, MD, CAQ [facility] [address] [place], [address] Phone: [phone] Fax: [phone]  04/12/2015  Patient: Angela Chapman, MRN: [id number], DOB: May 22, 1950, 65 y.o.  Primary Physician:  Kerby Nora, MD   Chief Complaint  Patient presents with  . Sinusitis    x 4 weeks  . Cough  . Fatigue  . Ear Pain  . Generalized Body Aches   Subjective:   [name] is a 65 y.o. very pleasant female patient who presents with the following:  Last 2 days has gotten really tough  OTC cough medshas been taking some antihistamines.  Taking some zyrtec  Weaker - head is killing her.  Has been swimming at [facility]. +/- fever, 99 or a little higher.  Coughing some.  Worse lying on side.   Past Medical History, Surgical History, Social History, Family History, Problem List, Medications, and Allergies have been reviewed and updated if relevant.  Patient Active Problem List   Diagnosis Date Noted  . Nasal sore, recurrent 12/03/2012  . BACK PAIN 03/17/2010  . GERD 12/10/2009  . ANXIETY DEPRESSION 11/23/2009  . HIP PAIN, BILATERAL 07/09/2009  . Other osteoporosis 06/09/2009  . PURE HYPERCHOLESTEROLEMIA 05/18/2009  . ALLERGIC RHINITIS 05/04/2008    Past Medical History  Diagnosis Date  . Allergy   . Fibromyalgia   . Blood transfusion without reported diagnosis   . Depression     no per pt  . GERD (gastroesophageal reflux disease)   . Broken ribs     fell and broke several ribs 12-23-12    Past Surgical History  Procedure Laterality Date  . Abdominal hysterectomy    . Colonoscopy    . Wrist surgery      left    Social History   Social History  . Marital Status: Married    Spouse Name: N/A  . Number of Children: N/A  . Years of Education: N/A   Occupational History  . 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 Smoking status: Never Smoker   . Smokeless tobacco: Never Used  .  Alcohol Use: No  . Drug Use: No  . Sexual Activity: Not on file   Other Topics Concern  . Not on file   Social History Narrative   Regular exercise yes 3 time weekly          Family History  Problem Relation Age of Onset  . Arthritis Mother   . Cancer Mother     breast  . Kidney disease Mother   . Diabetes Mother   . Stroke Father   . Colon cancer Neg Hx   . Esophageal cancer Neg Hx   . Rectal cancer Neg Hx   . Stomach cancer Neg Hx     Allergies  Allergen Reactions  . Oxycodone-Acetaminophen     REACTION: rash  . Sulfonamide Derivatives     REACTION: Swelling    Medication list reviewed and updated in full in [facility].  ROS: GEN: Acute illness details above GI: Tolerating PO intake GU: maintaining adequate hydration and urination Pulm: No SOB Interactive and getting along well at home.  Otherwise, ROS is as per the HPI.   Objective:   BP 120/70 mmHg  Pulse 86  Temp(Src) 97.8 F (36.6 C) (Oral)  Ht 5' 4.5" (1.638 m)  Wt 174 lb 8 oz (79.153 kg)  BMI 29.50 kg/m2  SpO2 95%  GEN: A and O x 3. WDWN. NAD.    ENT: Nose clear, ext NML.  No LAD.  No JVD.  TM's clear. Oropharynx clear.  Max sinus ttp PULM: Normal WOB, no distress. No crackles, wheezes, rhonchi. CV: RRR, no M/G/R, No rubs, No JVD.   EXT: warm and well-perfused, No c/c/e. PSYCH: Pleasant and conversant.    Laboratory and Imaging Data:  Assessment and Plan:   Walking pneumonia  Allergic rhinitis due to pollen  Max sinus pain > 1 month with multiple week pulmonary sx rx with doxy Add flonase  Follow-up: No Follow-up on file.  New Prescriptions   DOXYCYCLINE (VIBRA-TABS) 100 MG TABLET    Take 1 tablet (100 mg total) by mouth 2 (two) times daily.   Modified Medications   Modified Medication Previous Medication   FLUTICASONE (FLONASE) 50 MCG/ACT NASAL SPRAY fluticasone (FLONASE) 50 MCG/ACT nasal spray      Place 2 sprays into both nostrils daily.    Place into both  nostrils 2 (two) times daily.   No orders of the defined types were placed in this encounter.    Signed,  Elpidio Galea. Sukhmani Fetherolf, MD   Patient's Medications  New Prescriptions   DOXYCYCLINE (VIBRA-TABS) 100 MG TABLET    Take 1 tablet (100 mg total) by mouth 2 (two) times daily.  Previous Medications   ACETAMINOPHEN (TYLENOL) 325 MG TABLET    Take 650 mg by mouth every 6 (six) hours as needed.     ALUM HYDROXIDE-MAG CARBONATE (GAVISCON PO)    Take by mouth as needed.   AMITRIPTYLINE (ELAVIL) 10 MG TABLET    Take 10 mg by mouth at bedtime as needed.    CALCIUM CARBONATE-VITAMIN D (CALCIUM 600+D) 600-400 MG-UNIT PER TABLET    Take 1 tablet by mouth 2 (two) times daily.     CETIRIZINE (ZYRTEC) 10 MG TABLET    Take 10 mg by mouth daily.   CYCLOBENZAPRINE (FLEXERIL) 10 MG TABLET    Take 10 mg by mouth at bedtime as needed.   FAMOTIDINE (PEPCID) 20 MG TABLET    Take 20 mg by mouth 2 (two) times daily as needed.    MELOXICAM (MOBIC) 15 MG TABLET    Take 15 mg by mouth daily as needed.   TRIAMCINOLONE CREAM (KENALOG) 0.1 %    Apply 1 application topically 2 (two) times daily as needed.   VALACYCLOVIR (VALTREX) 1000 MG TABLET    Take 1 tablet (1,000 mg total) by mouth as needed.  Modified Medications   Modified Medication Previous Medication   FLUTICASONE (FLONASE) 50 MCG/ACT NASAL SPRAY fluticasone (FLONASE) 50 MCG/ACT nasal spray      Place 2 sprays into both nostrils daily.    Place into both nostrils 2 (two) times daily.  Discontinued Medications   AMOXICILLIN-CLAVULANATE (AUGMENTIN) 875-125 MG TABLET    Take 1 tablet by mouth 2 (two) times daily.

## 2015-04-12 NOTE — Progress Notes (Signed)
Pre visit review using our clinic review tool, if applicable. No additional management support is needed unless otherwise documented below in the visit note. 

## 2015-05-03 ENCOUNTER — Telehealth: Payer: Self-pay | Admitting: Family Medicine

## 2015-05-03 NOTE — Telephone Encounter (Signed)
Pt has appt 05/04/15 at 10:15 with Dr Milinda Antisower.

## 2015-05-03 NOTE — Telephone Encounter (Signed)
Patient Name: Angela Chapman DOB: 1950-10-29 Initial Comment Caller states she has had sinus and chest stuff since January. She has been on a couple of antibiotics and isn't any better. Nurse Assessment Nurse: Yetta BarreJones, RN, Miranda Date/Time (Eastern Time): 05/03/2015 1:50:36 PM Confirm and document reason for call. If symptomatic, describe symptoms. You must click the next button to save text entered. ---Caller states she has had nasal congestion and productive cough off and on for 2 months. She was recently seen and treated with antibiotics 3 weeks ago. Has the patient traveled out of the country within the last 30 days? ---No Does the patient have any new or worsening symptoms? ---Yes Will a triage be completed? ---Yes Related visit to physician within the last 2 weeks? ---No Does the PT have any chronic conditions? (i.e. diabetes, asthma, etc.) ---Yes List chronic conditions. ---Allergies Is this a behavioral health or substance abuse call? ---No Guidelines Guideline Title Affirmed Question Affirmed Notes Cough - Chronic [1] Nasal discharge AND [2] present > 10 days Final Disposition User See PCP When Office is Open (within 3 days) Yetta BarreJones, RN, Miranda Comments No appt available with PCP within the next 3 days. Appt scheduled with Dr. Milinda Antisower tomorrow 05/04/15 at 1015. Disagree/Comply: Comply

## 2015-05-03 NOTE — Telephone Encounter (Signed)
I will see her then  Cc to PCP 

## 2015-05-04 ENCOUNTER — Ambulatory Visit: Payer: Self-pay | Admitting: Family Medicine

## 2015-08-02 ENCOUNTER — Encounter: Payer: Self-pay | Admitting: Family Medicine

## 2015-08-02 ENCOUNTER — Other Ambulatory Visit: Payer: Self-pay | Admitting: Family Medicine

## 2015-08-02 ENCOUNTER — Ambulatory Visit (INDEPENDENT_AMBULATORY_CARE_PROVIDER_SITE_OTHER): Payer: BLUE CROSS/BLUE SHIELD

## 2015-08-02 ENCOUNTER — Ambulatory Visit (INDEPENDENT_AMBULATORY_CARE_PROVIDER_SITE_OTHER): Payer: Medicaid Other | Admitting: Family Medicine

## 2015-08-02 VITALS — BP 132/70 | HR 92 | Temp 97.7°F | Ht 64.5 in | Wt 169.5 lb

## 2015-08-02 DIAGNOSIS — R509 Fever, unspecified: Secondary | ICD-10-CM

## 2015-08-02 DIAGNOSIS — R5383 Other fatigue: Secondary | ICD-10-CM | POA: Diagnosis not present

## 2015-08-02 MED ORDER — FLUCONAZOLE 150 MG PO TABS: 150.0000 mg | ORAL_TABLET | Freq: Once | ORAL | Status: DC

## 2015-08-02 MED ORDER — DOXYCYCLINE HYCLATE 100 MG PO TABS: 100.0000 mg | ORAL_TABLET | Freq: Two times a day (BID) | ORAL | Status: DC

## 2015-08-02 NOTE — Patient Instructions (Signed)
Take the medication as prescribed.  We will call with the results.  Take care  Dr. Adriana Simasook

## 2015-08-02 NOTE — Assessment & Plan Note (Signed)
New problem. Reported low grade fever.  Chest xray today given lung exam. Xray was negative. Given symptoms and "bite" (and primarily from concern the patient), obtaining lyme PCR and treating with Doxycycline.

## 2015-08-02 NOTE — Progress Notes (Signed)
Subjective:  Patient ID: Angela Chapman, female    DOB: 1950/12/24  Age: 65 y.o. MRN: [id number]  CC: Weakness/fatigue, low grade fever, recent "bite"  HPI:  65 year old female presents with the above complaints.  Patient states that she suffered a bite a few weeks ago. She states that for the past 2 days she's felt poorly. She's been experiencing a low-grade fever per her report (99.7), chills, weakness, fatigue. She states that she's also been expressing joint pain/stiffness. Also she's had slightly productive cough. She reports that yesterday she was feeling so bad she couldn't get out of bed. No known exacerbating factors. She's been taking Tylenol and ibuprofen without resolution of her symptoms. No reports of shortness of breath. She reports recent contact with a sick child. No other complaints this time.  Social Hx   Social History   Social History  . Marital Status: Married    Spouse Name: N/A  . Number of Children: N/A  . Years of Education: N/A   Occupational History  . 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 Smoking status: Never Smoker   . Smokeless tobacco: Never Used  . Alcohol Use: No  . Drug Use: No  . Sexual Activity: Not Asked   Other Topics Concern  . None   Social History Narrative   Regular exercise yes 3 time weekly         Review of Systems  Constitutional: Positive for fever, chills and fatigue.  Respiratory: Positive for cough.   Neurological: Positive for weakness.   Objective:  BP 132/70 mmHg  Pulse 92  Temp(Src) 97.7 F (36.5 C) (Oral)  Ht 5' 4.5" (1.638 m)  Wt 169 lb 8 oz (76.885 kg)  BMI 28.66 kg/m2  SpO2 96%  BP/Weight 08/02/2015 04/12/2015 01/12/2015  Systolic BP 132 120 130  Diastolic BP 70 70 74  Wt. (Lbs) 169.5 174.5 176  BMI 28.66 29.5 29.75   Physical Exam  Constitutional: She is oriented to person, place, and time. She appears well-developed. No distress.  HENT:  Head: Normocephalic and atraumatic.    Mouth/Throat: Oropharynx is clear and moist.  Normal TM's bilaterally.  Eyes: Conjunctivae are normal. No scleral icterus.  Neck: Neck supple.  Cardiovascular: Normal rate and regular rhythm.   Pulmonary/Chest: Effort normal.  R basilar crackles.  Lymphadenopathy:    She has no cervical adenopathy.  Neurological: She is alert and oriented to person, place, and time.  Skin:  Left buttock - area of prior bite appears normal.  Vitals reviewed.  Lab Results  Component Value Date   WBC 5.3 10/31/2011   HGB 13.2 10/31/2011   HCT 40.5 10/31/2011   PLT 241.0 10/31/2011   GLUCOSE 96 11/29/2012   CHOL 177 11/29/2012   TRIG 49.0 11/29/2012   HDL 71.60 11/29/2012   LDLCALC 96 11/29/2012   ALT 18 11/29/2012   AST 21 11/29/2012   NA 141 11/29/2012   K 4.3 11/29/2012   CL 105 11/29/2012   CREATININE 0.8 11/29/2012   BUN 15 11/29/2012   CO2 29 11/29/2012   TSH 2.72 05/18/2009   Assessment & Plan:   Problem List Items Addressed This Visit    Fatigue    Unclear etiology. Treating as outlined in separate problem.      Fever - Primary    New problem. Reported low grade fever.  Chest xray today given lung exam. Xray was negative. Given symptoms and "bite" (and primarily from concern the patient),  obtaining lyme PCR and treating with Doxycycline.      Relevant Orders   DG Chest 2 View (Completed)   Lyme disease dna by pcr(borrelia burg)      Meds ordered this encounter  Medications  . doxycycline (VIBRA-TABS) 100 MG tablet    Sig: Take 1 tablet (100 mg total) by mouth 2 (two) times daily.    Dispense:  20 tablet    Refill:  0  . fluconazole (DIFLUCAN) 150 MG tablet    Sig: Take 1 tablet (150 mg total) by mouth once. Repeat x 1 in 72 hours.    Dispense:  2 tablet    Refill:  2    Follow-up: PRN  Everlene Other DO [facility]

## 2015-08-02 NOTE — Assessment & Plan Note (Signed)
Unclear etiology. Treating as outlined in separate problem.

## 2015-08-04 ENCOUNTER — Telehealth: Payer: Self-pay | Admitting: *Deleted

## 2015-08-04 ENCOUNTER — Telehealth: Payer: Self-pay | Admitting: Family Medicine

## 2015-08-04 NOTE — Telephone Encounter (Signed)
Patient Name: Angela CottonANCY Mccormac DOB: Jan 06, 1951 Initial Comment Caller states his wife has fever for five days now, taking doxycycline 100mg  2x daily since Monday, but still has fever 99.4 most recent, Nurse Assessment Nurse: Angela CooterHenry, RN, Angela Chapman Date/Time Angela Chapman(Eastern Time): 08/04/2015 1:42:01 PM Confirm and document reason for call. If symptomatic, describe symptoms. You must click the next button to save text entered. ---Caller states that his wife has had a fever for 5 days. She is on Doxycline since Monday, about 48 hours. He does not know what she was prescribed the doxycycline for unless a chest infection. She was negative for pneumonia. She does have rhonchi. Temp is 99.4 orally. Has the patient traveled out of the country within the last 30 days? ---No Does the patient have any new or worsening symptoms? ---Yes Will a triage be completed? ---Yes Related visit to physician within the last 2 weeks? ---Yes Does the PT have any chronic conditions? (i.e. diabetes, asthma, etc.) ---No Is this a behavioral health or substance abuse call? ---No Guidelines Guideline Title Affirmed Question Affirmed Notes Cough - Acute Productive Fever present > 3 days (72 hours) Final Disposition User See Physician within 24 Hours Angela CooterHenry, RN, Angela Chapman Comments Appt set with Angela Chapman for tomorrow 6/15 at 10:15a Referrals REFERRED TO PCP OFFICE Disagree/Comply: Comply

## 2015-08-04 NOTE — Telephone Encounter (Signed)
Patient was seen in the office on 06/12. She was prescribed doxycyline,however she has the same symptoms. She requested a call to consult the issue, or a change of medication.  Pt contact [phone]

## 2015-08-04 NOTE — Telephone Encounter (Signed)
Patient is aware and will call her PCP

## 2015-08-04 NOTE — Telephone Encounter (Signed)
Needs to see PCP

## 2015-08-04 NOTE — Telephone Encounter (Signed)
On set Saturday patient has had a fever of 102 at night.  She has taken tylenol, ibuprofen, and Asprin.  She has had a productive cough grayish in color and head congestion.  She has had low grade fever through the day, nausea, weakness, and fatigue.  Please advise?  Patient was told that she may need an office visit.

## 2015-08-05 ENCOUNTER — Ambulatory Visit: Payer: Self-pay | Admitting: Internal Medicine

## 2015-08-05 NOTE — Telephone Encounter (Signed)
Pt had appt 08/05/15 but [facility] fever gone.

## 2015-08-06 LAB — LYME AB/WESTERN BLOT REFLEX: B burgdorferi Ab IgG+IgM: <0.9 Index (ref <0.90)

## 2015-08-27 ENCOUNTER — Telehealth: Payer: Self-pay | Admitting: Family Medicine

## 2015-08-27 NOTE — Telephone Encounter (Signed)
No need to go to ER. Okay to see Eye MD.

## 2015-08-27 NOTE — Telephone Encounter (Signed)
Patient Name: Angela Chapman DOB: 12/06/1950 Initial Comment Caller states she scratched her eye. Nurse Assessment Nurse: Charna Elizabethrumbull, RN, Cathy Date/Time (Eastern Time): 08/27/2015 8:25:56 AM Confirm and document reason for call. If symptomatic, describe symptoms. You must click the next button to save text entered. ---Angela Chapman states she scratched her right eye this past week and continues to have pain (rated as a 4 on the 1 to 10 scale). No fever. Alert and responsive. Has the patient traveled out of the country within the last 30 days? ---No Does the patient have any new or worsening symptoms? ---Yes Will a triage be completed? ---Yes Related visit to physician within the last 2 weeks? ---No Does the PT have any chronic conditions? (i.e. diabetes, asthma, etc.) ---No Is this a behavioral health or substance abuse call? ---No Guidelines Guideline Title Affirmed Question Affirmed Notes Eye Pain [1] Foreign body sensation ("feels like something is in there") AND [2] irrigation didn't help Final Disposition User Go to ED Now Charna Elizabethrumbull, RN, [employer] Comments Keiran declined the Go to ER diposition. Reinforced the Go to ER disposition. She plans to see her Eye MD today. Encouraged to call back as needed. Referrals GO TO FACILITY REFUSED Disagree/Comply: Disagree Disagree/Comply Reason: Disagree with instructions Called the office backline and notified Lyla SonCarrie and Reena.

## 2015-11-15 ENCOUNTER — Encounter: Payer: Self-pay | Admitting: Primary Care

## 2015-11-15 ENCOUNTER — Ambulatory Visit (INDEPENDENT_AMBULATORY_CARE_PROVIDER_SITE_OTHER): Payer: BLUE CROSS/BLUE SHIELD | Admitting: Primary Care

## 2015-11-15 ENCOUNTER — Ambulatory Visit
Admission: RE | Admit: 2015-11-15 | Discharge: 2015-11-15 | Disposition: A | Discharge status: Home / Self Care | Payer: BLUE CROSS/BLUE SHIELD | Source: Ambulatory Visit | Attending: Primary Care | Admitting: Primary Care

## 2015-11-15 VITALS — BP 120/70 | HR 66 | Temp 98.4°F | Ht 64.5 in | Wt 168.4 lb

## 2015-11-15 DIAGNOSIS — R6884 Jaw pain: Secondary | ICD-10-CM | POA: Diagnosis not present

## 2015-11-15 DIAGNOSIS — Z23 Encounter for immunization: Secondary | ICD-10-CM | POA: Diagnosis not present

## 2015-11-15 MED ORDER — NAPROXEN 500 MG PO TABS: 500.0000 mg | ORAL_TABLET | Freq: Two times a day (BID) | ORAL | 0 refills | Status: DC

## 2015-11-15 NOTE — Progress Notes (Signed)
Pre visit review using our clinic review tool, if applicable. No additional management support is needed unless otherwise documented below in the visit note. 

## 2015-11-15 NOTE — Patient Instructions (Signed)
Stop by the front desk and speak with either Shirlee Limerick or Revonda Standard regarding your Xray at [facility].  Start Naproxen tablets for pain and inflammation. Take 1 tablet by mouth twice daily for 7-10 days.  You were provided with a flu shot today.  Take a look at the information below regarding the likely cause for your symptoms.  Please notify me if no improvement in your discomfort in 1 week.  It was a pleasure meeting you!   Temporomandibular Joint Syndrome Temporomandibular joint (TMJ) syndrome is a condition that affects the joints between your jaw and your skull. The TMJs are located near your ears and allow your jaw to open and close. These joints and the nearby muscles are involved in all movements of the jaw. People with TMJ syndrome have pain in the area of these joints and muscles. Chewing, biting, or other movements of the jaw can be difficult or painful. TMJ syndrome can be caused by various things. In many cases, the condition is mild and goes away within a few weeks. For some people, the condition can become a long-term problem. CAUSES Possible causes of TMJ syndrome include:  Grinding your teeth or clenching your jaw. Some people do this when they are under stress.  Arthritis.  Injury to the jaw.  Head or neck injury.  Teeth or dentures that are not aligned well. In some cases, the cause of TMJ syndrome may not be known. SIGNS AND SYMPTOMS The most common symptom is an aching pain on the side of the head in the area of the TMJ. Other symptoms may include:  Pain when moving your jaw, such as when chewing or biting.  Being unable to open your jaw all the way.  Making a clicking sound when you open your mouth.  Headache.  Earache.  Neck or shoulder pain. DIAGNOSIS Diagnosis can usually be made based on your symptoms, your medical history, and a physical exam. Your health care provider may check the range of motion of your jaw. Imaging tests, such as X-rays or  an MRI, are sometimes done. You may need to see your dentist to determine if your teeth and jaw are lined up correctly. TREATMENT TMJ syndrome often goes away on its own. If treatment is needed, the options may include:  Eating soft foods and applying ice or heat.  Medicines to relieve pain or inflammation.  Medicines to relax the muscles.  A splint, bite plate, or mouthpiece to prevent teeth grinding or jaw clenching.  Relaxation techniques or counseling to help reduce stress.  Transcutaneous electrical nerve stimulation (TENS). This helps to relieve pain by applying an electrical current through the skin.  Acupuncture. This is sometimes helpful to relieve pain.  Jaw surgery. This is rarely needed. HOME CARE INSTRUCTIONS  Take medicines only as directed by your health care provider.  Eat a soft diet if you are having trouble chewing.  Apply ice to the painful area.  Put ice in a plastic bag.  Place a towel between your skin and the bag.  Leave the ice on for 20 minutes, 2-3 times a day.  Apply a warm compress to the painful area as directed.  Massage your jaw area and perform any jaw stretching exercises as recommended by your health care provider.  If you were given a mouthpiece or bite plate, wear it as directed.  Avoid foods that require a lot of chewing. Do not chew gum.  Keep all follow-up visits as directed by your health care  provider. This is important. SEEK MEDICAL CARE IF:  You are having trouble eating.  You have new or worsening symptoms. SEEK IMMEDIATE MEDICAL CARE IF:  Your jaw locks open or closed.   This information is not intended to replace advice given to you by your health care provider. Make sure you discuss any questions you have with your health care provider.   Document Released: 11/01/2000 Document Revised: 02/27/2014 Document Reviewed: 09/11/2013 Elsevier Interactive Patient Education [employer]2016 Elsevier Inc.

## 2015-11-15 NOTE — Progress Notes (Signed)
Subjective:    Patient ID: Gevena CottonNancy Kocian, female    DOB: [birth year], 65 y.o.   MRN: [id number]  HPI  Ms. Alanson Alyheron is a 65 year old female who presents today with a chief complaint of jaw and posterior neck pain. Her pain is located to the right upper jaw which has been present since 09/24/15. She also reports swelling, ear pain, difficulty opening her mouth. She feels as though she has a Environmental health practitioner"marble in my ear" and "a golf ball in the back of my neck". She has noticed a lump that will intermittently increase and decrease to the base of her right occipital lobe. She purchased a mouth guard to use at night as she thought perhaps she was grinding her teeth, but this did not improve her symptoms. She called her dentist who told her to come to her PCP for further evaluation.   Review of Systems  Constitutional: Negative for chills and fever.  HENT: Negative for congestion.        Right jaw pain  Respiratory: Negative for cough and shortness of breath.   Cardiovascular: Negative for chest pain.  Allergic/Immunologic: Positive for environmental allergies.  Neurological: Negative for numbness.       Past Medical History:  Diagnosis Date  . Allergy   . Blood transfusion without reported diagnosis   . Broken ribs    fell and broke several ribs 12-23-12  . Depression    no per pt  . Fibromyalgia   . GERD (gastroesophageal reflux disease)      Social History   Social History  . Marital status: Married    Spouse name: N/A  . Number of children: N/A  . Years of education: N/A   Occupational History  . 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 Smoking status: Never Smoker  . Smokeless tobacco: Never Used  . Alcohol use No  . Drug use: No  . Sexual activity: Not on file   Other Topics Concern  . Not on file   Social History Narrative   Regular exercise yes 3 time weekly          Past Surgical History:  Procedure Laterality Date  . ABDOMINAL HYSTERECTOMY    .  COLONOSCOPY    . WRIST SURGERY     left    Family History  Problem Relation Age of Onset  . Arthritis Mother   . Cancer Mother     breast  . Kidney disease Mother   . Diabetes Mother   . Stroke Father   . Colon cancer Neg Hx   . Esophageal cancer Neg Hx   . Rectal cancer Neg Hx   . Stomach cancer Neg Hx     Allergies  Allergen Reactions  . Oxycodone-Acetaminophen     REACTION: rash  . Sulfonamide Derivatives     REACTION: Swelling    Current Outpatient Prescriptions on File Prior to Visit  Medication Sig Dispense Refill  . acetaminophen (TYLENOL) 325 MG tablet Take 650 mg by mouth every 6 (six) hours as needed.      . Alum Hydroxide-Mag Carbonate (GAVISCON PO) Take by mouth as needed.    Marland Kitchen. amitriptyline (ELAVIL) 10 MG tablet Take 10 mg by mouth at bedtime as needed.     . Calcium Carbonate-Vitamin D (CALCIUM 600+D) 600-400 MG-UNIT per tablet Take 1 tablet by mouth 2 (two) times daily.      . famotidine (PEPCID) 20 MG tablet Take 20 mg  by mouth 2 (two) times daily as needed.     . fluticasone (FLONASE) 50 MCG/ACT nasal spray Place 2 sprays into both nostrils daily. 16 g 3  . cyclobenzaprine (FLEXERIL) 10 MG tablet Take 10 mg by mouth at bedtime as needed.    . meloxicam (MOBIC) 15 MG tablet Take 15 mg by mouth daily as needed.    . triamcinolone cream (KENALOG) 0.1 % Apply 1 application topically 2 (two) times daily as needed.    . valACYclovir (VALTREX) 1000 MG tablet Take 1 tablet (1,000 mg total) by mouth as needed. (Patient not taking: Reported on 11/15/2015) 10 tablet 3   No current facility-administered medications on file prior to visit.     BP 120/70   Pulse 66   Temp 98.4 F (36.9 C) (Oral)   Ht 5' 4.5" (1.638 m)   Wt 168 lb 6.4 oz (76.4 kg)   SpO2 98%   BMI 28.46 kg/m    Objective:   Physical Exam  Constitutional: She appears well-nourished.  HENT:  Right Ear: Tympanic membrane and ear canal normal.  Left Ear: Tympanic membrane and ear canal normal.    Nose: Right sinus exhibits no maxillary sinus tenderness and no frontal sinus tenderness. Left sinus exhibits no maxillary sinus tenderness and no frontal sinus tenderness.  Mouth/Throat: Oropharynx is clear and moist.  Moderate tenderness to zygomatic bone, decreased ability to open mouth. Right temporal region nontender. No obvious swelling. Right ear unremarkable.  Eyes: Conjunctivae are normal.  Neck: Neck supple.  Cardiovascular: Normal rate and regular rhythm.   Pulmonary/Chest: Effort normal and breath sounds normal. She has no wheezes. She has no rales.  Lymphadenopathy:    She has no cervical adenopathy.  Skin: Skin is warm and dry.  No obvious lymph node or mass to right occipital lobe.          Assessment & Plan:  Jaw pain:  Located to right jaw since early August 2017. Exam and history of present illness today with findings consistent of TMJ. Right ear and occipital lobe unremarkable. Suspect the lump to her head likely to be lymph node. No such mass or lymph node noted today. Will obtain x-ray of right mandible for further evaluation. Home care instructions provided today. Prescription for naproxen 500 mg twice daily for 10 days sent to pharmacy. X-ray pending. Do not suspect bacterial involvement to bone.  Morrie Sheldon, NP

## 2015-12-20 ENCOUNTER — Telehealth: Payer: Self-pay | Admitting: Family Medicine

## 2015-12-20 NOTE — Telephone Encounter (Signed)
Please call and schedule CPE with fasting labs prior for Dr. Bedsole.  

## 2015-12-22 NOTE — Telephone Encounter (Signed)
Left message asking pt to call office  °

## 2015-12-30 NOTE — Telephone Encounter (Signed)
12/5 labs 12/12 cpx

## 2016-01-25 ENCOUNTER — Other Ambulatory Visit (INDEPENDENT_AMBULATORY_CARE_PROVIDER_SITE_OTHER): Payer: BLUE CROSS/BLUE SHIELD

## 2016-01-25 ENCOUNTER — Telehealth: Payer: Self-pay | Admitting: Family Medicine

## 2016-01-25 DIAGNOSIS — M810 Age-related osteoporosis without current pathological fracture: Secondary | ICD-10-CM | POA: Diagnosis not present

## 2016-01-25 DIAGNOSIS — Z1159 Encounter for screening for other viral diseases: Secondary | ICD-10-CM

## 2016-01-25 DIAGNOSIS — E7800 Pure hypercholesterolemia, unspecified: Secondary | ICD-10-CM

## 2016-01-25 LAB — COMPREHENSIVE METABOLIC PANEL
ALBUMIN: 4 g/dL (ref 3.5–5.2)
ALK PHOS: 73 U/L (ref 39–117)
ALT: 20 U/L (ref 0–35)
AST: 24 U/L (ref 0–37)
BUN: 16 mg/dL (ref 6–23)
CALCIUM: 9.3 mg/dL (ref 8.4–10.5)
CHLORIDE: 105 meq/L (ref 96–112)
CO2: 32 mEq/L (ref 19–32)
Creatinine, Ser: 0.81 mg/dL (ref 0.40–1.20)
GFR: 75.86 mL/min (ref >60.00)
Glucose, Bld: 95 mg/dL (ref 70–99)
POTASSIUM: 4.7 meq/L (ref 3.5–5.1)
Sodium: 142 mEq/L (ref 135–145)
TOTAL PROTEIN: 6.9 g/dL (ref 6.0–8.3)
Total Bilirubin: 0.5 mg/dL (ref 0.2–1.2)

## 2016-01-25 LAB — LIPID PANEL
CHOLESTEROL: 182 mg/dL (ref 0–200)
HDL: 77 mg/dL (ref >39.00)
LDL CALC: 97 mg/dL (ref 0–99)
NonHDL: 104.7
TRIGLYCERIDES: 41 mg/dL (ref 0.0–149.0)
Total CHOL/HDL Ratio: 2
VLDL: 8.2 mg/dL (ref 0.0–40.0)

## 2016-01-25 LAB — VITAMIN D 25 HYDROXY (VIT D DEFICIENCY, FRACTURES): VITD: 26.6 ng/mL — AB (ref 30.00–100.00)

## 2016-01-25 NOTE — Telephone Encounter (Signed)
-----   Message from Alvina Chouerri J Walsh sent at 01/19/2016 10:27 AM EST ----- Regarding: Lab orders for Tuesday, 12.5.17 Patient is scheduled for CPX labs, please order future labs, Thanks , Camelia Engerri

## 2016-01-26 LAB — HEPATITIS C ANTIBODY: HCV AB: NEGATIVE

## 2016-02-01 ENCOUNTER — Ambulatory Visit (INDEPENDENT_AMBULATORY_CARE_PROVIDER_SITE_OTHER): Payer: BLUE CROSS/BLUE SHIELD | Admitting: Family Medicine

## 2016-02-01 ENCOUNTER — Encounter: Payer: Self-pay | Admitting: Family Medicine

## 2016-02-01 VITALS — BP 140/80 | HR 78 | Temp 98.2°F | Ht 64.0 in | Wt 168.5 lb

## 2016-02-01 DIAGNOSIS — Z0000 Encounter for general adult medical examination without abnormal findings: Secondary | ICD-10-CM

## 2016-02-01 DIAGNOSIS — J3089 Other allergic rhinitis: Secondary | ICD-10-CM | POA: Diagnosis not present

## 2016-02-01 DIAGNOSIS — E7800 Pure hypercholesterolemia, unspecified: Secondary | ICD-10-CM | POA: Diagnosis not present

## 2016-02-01 MED ORDER — AMITRIPTYLINE HCL 10 MG PO TABS: 10.0000 mg | ORAL_TABLET | Freq: Every evening | ORAL | 1 refills | Status: DC | PRN

## 2016-02-01 MED ORDER — FLUTICASONE PROPIONATE 50 MCG/ACT NA SUSP: 2.0000 | Freq: Every day | NASAL | 11 refills | Status: DC

## 2016-02-01 NOTE — Assessment & Plan Note (Signed)
Well controled on no meds.

## 2016-02-01 NOTE — Progress Notes (Signed)
Subjective:    Patient ID: Gevena CottonNancy Fernandez, female    DOB: 1950-04-11, 65 y.o.   MRN: [id number]  HPI  The patient is here for annual wellness exam and preventative care.    Husband dx with heart issues. Increase in stress in household.  Evaluated in 10/2015 for jaw pain..? TMJ  Has now improved with essential oils.  Allergic rhinitis: On allegra and flonase ( only uses 2 sprays per  Right ear with congestion, nasal congestion as well. No ear pain.  Elevated Cholesterol:  Well controlled on no medication. Lab Results  Component Value Date   CHOL 182 01/25/2016   HDL 77.00 01/25/2016   LDLCALC 97 01/25/2016   TRIG 41.0 01/25/2016   CHOLHDL 2 01/25/2016  Diet compliance: Low cholesterol diet Exercise:3 times a week Other complaints:  Has lost 10 lbs in 1 year. Body mass index is 28.92 kg/m.  Wt Readings from Last 3 Encounters:  02/01/16 168 lb 8 oz (76.4 kg)  11/15/15 168 lb 6.4 oz (76.4 kg)  08/02/15 169 lb 8 oz (76.9 kg)   BP Readings from Last 3 Encounters:  02/01/16 140/80  11/15/15 120/70  08/02/15 132/70     Vit D low:  Not sure if taking supplement.  Social History /Family History/Past Medical History reviewed and updated if needed.  Review of Systems  Constitutional: Negative for fatigue and fever.  HENT: Negative for congestion.   Eyes: Negative for pain.  Respiratory: Negative for cough and shortness of breath.   Cardiovascular: Negative for chest pain, palpitations and leg swelling.  Gastrointestinal: Negative for abdominal pain.  Genitourinary: Negative for dysuria and vaginal bleeding.  Musculoskeletal: Positive for back pain.  Neurological: Negative for syncope, light-headedness and headaches.  Psychiatric/Behavioral: Negative for dysphoric mood.       Objective:   Physical Exam  Constitutional: Vital signs are normal. She appears well-developed and well-nourished. She is cooperative.  Non-toxic appearance. She does not appear ill. No distress.   HENT:  Head: Normocephalic.  Right Ear: Hearing, tympanic membrane, external ear and ear canal normal.  Left Ear: Hearing, tympanic membrane, external ear and ear canal normal.  Nose: Nose normal.  Eyes: Conjunctivae, EOM and lids are normal. Pupils are equal, round, and reactive to light. Lids are everted and swept, no foreign bodies found.  Neck: Trachea normal and normal range of motion. Neck supple. Carotid bruit is not present. No thyroid mass and no thyromegaly present.  Cardiovascular: Normal rate, regular rhythm, S1 normal, S2 normal, normal heart sounds and intact distal pulses.  Exam reveals no gallop.   No murmur heard. Pulmonary/Chest: Effort normal and breath sounds normal. No respiratory distress. She has no wheezes. She has no rhonchi. She has no rales.  Abdominal: Soft. Normal appearance and bowel sounds are normal. She exhibits no distension, no fluid wave, no abdominal bruit and no mass. There is no hepatosplenomegaly. There is no tenderness. There is no rebound, no guarding and no CVA tenderness. No hernia.  Lymphadenopathy:    She has no cervical adenopathy.    She has no axillary adenopathy.  Neurological: She is alert. She has normal strength. No cranial nerve deficit or sensory deficit.  Skin: Skin is warm, dry and intact. No rash noted.  Psychiatric: Her speech is normal and behavior is normal. Judgment normal. Her mood appears not anxious. Cognition and memory are normal. She does not exhibit a depressed mood.          Assessment & Plan:  The  patient's preventative maintenance and recommended screening tests for an annual wellness exam were reviewed in full today. Brought up to date unless services declined.  Counselled on the importance of diet, exercise, and its role in overall health and mortality. The patient's FH and SH was reviewed, including their home life, tobacco status, and drug and alcohol status.   Vaccines: uptodate except zostavax, flu  uptodate Pap/DVE:  Not indicated TAH Mammo: 03/2014, repeat q2 years Bone Density: osteoporosis improved 10/2011: Recheck in 2018 Colon: 2015 Dr. Christella HartiganJacobs, nml, repat in 10 years..  Smoking Status: none  HIV screen:   refused. Hep C: negative

## 2016-02-01 NOTE — Patient Instructions (Addendum)
Increase flonase to 2 sprays per nostril daily.  Can try Xyzal at bedtime instead off allegra for 2-3 weeks.  Vit D 3 400 IU 1-2 times daily.  Call to schedule mammogram on your own after 03/2016.

## 2016-02-01 NOTE — Addendum Note (Signed)
Addended by: Damita LackLORING, Natthew Marlatt S on: 02/01/2016 02:32 PM   Modules accepted: Orders

## 2016-02-01 NOTE — Assessment & Plan Note (Signed)
Increase flonase to 2 sprays per nostril.Change to Xyzal instead of allegra.

## 2016-02-01 NOTE — Progress Notes (Signed)
Pre visit review using our clinic review tool, if applicable. No additional management support is needed unless otherwise documented below in the visit note. 

## 2016-08-04 ENCOUNTER — Encounter: Payer: Self-pay | Admitting: Family Medicine

## 2016-08-04 ENCOUNTER — Ambulatory Visit (INDEPENDENT_AMBULATORY_CARE_PROVIDER_SITE_OTHER): Payer: BLUE CROSS/BLUE SHIELD | Admitting: Family Medicine

## 2016-08-04 DIAGNOSIS — J0100 Acute maxillary sinusitis, unspecified: Secondary | ICD-10-CM | POA: Diagnosis not present

## 2016-08-04 DIAGNOSIS — B9689 Other specified bacterial agents as the cause of diseases classified elsewhere: Secondary | ICD-10-CM | POA: Insufficient documentation

## 2016-08-04 DIAGNOSIS — J019 Acute sinusitis, unspecified: Secondary | ICD-10-CM

## 2016-08-04 MED ORDER — AMOXICILLIN 500 MG PO CAPS: 1000.0000 mg | ORAL_CAPSULE | Freq: Two times a day (BID) | ORAL | 0 refills | Status: DC

## 2016-08-04 NOTE — Progress Notes (Signed)
   Subjective:    Patient ID: Angela Chapman, female    DOB: 02-Jul-1950, 66 y.o.   MRN: [id number]  Sinusitis  This is a new problem. The current episode started 1 to 4 weeks ago ( started after sweeping out barn). The problem has been gradually worsening since onset. There has been no fever. Associated symptoms include congestion, coughing, ear pain, headaches, sinus pressure and a sore throat. Pertinent negatives include no chills or shortness of breath. (B ear pain) Past treatments include oral decongestants ( OTC allergy med, not allegra , and flonase).  Cough  This is a new problem. The current episode started 1 to 4 weeks ago. The cough is productive of purulent sputum. Associated symptoms include ear pain, headaches, nasal congestion, postnasal drip and a sore throat. Pertinent negatives include no chills, hemoptysis, shortness of breath or wheezing.      Review of Systems  Constitutional: Negative for chills.  HENT: Positive for congestion, ear pain, postnasal drip, sinus pressure and sore throat.   Respiratory: Positive for cough. Negative for hemoptysis, shortness of breath and wheezing.   Neurological: Positive for headaches.       Objective:   Physical Exam  Constitutional: Vital signs are normal. She appears well-developed and well-nourished. She is cooperative.  Non-toxic appearance. She does not appear ill. No distress.  HENT:  Head: Normocephalic.  Right Ear: Hearing, tympanic membrane, external ear and ear canal normal. Tympanic membrane is not erythematous, not retracted and not bulging. No middle ear effusion.  Left Ear: Hearing, tympanic membrane, external ear and ear canal normal. Tympanic membrane is not erythematous, not retracted and not bulging.  No middle ear effusion.  Nose: Mucosal edema and rhinorrhea present. Right sinus exhibits maxillary sinus tenderness. Right sinus exhibits no frontal sinus tenderness. Left sinus exhibits maxillary sinus tenderness. Left  sinus exhibits no frontal sinus tenderness.  Mouth/Throat: Uvula is midline, oropharynx is clear and moist and mucous membranes are normal.  Eyes: Conjunctivae, EOM and lids are normal. Pupils are equal, round, and reactive to light. Lids are everted and swept, no foreign bodies found.  Neck: Trachea normal and normal range of motion. Neck supple. Carotid bruit is not present. No thyroid mass and no thyromegaly present.  Cardiovascular: Normal rate, regular rhythm, S1 normal, S2 normal, normal heart sounds, intact distal pulses and normal pulses.  Exam reveals no gallop and no friction rub.   No murmur heard. Pulmonary/Chest: Effort normal and breath sounds normal. No tachypnea. No respiratory distress. She has no decreased breath sounds. She has no wheezes. She has no rhonchi. She has no rales.  Neurological: She is alert.  Skin: Skin is warm, dry and intact. No rash noted.  Psychiatric: Her speech is normal and behavior is normal. Judgment normal. Her mood appears not anxious. Cognition and memory are normal. She does not exhibit a depressed mood.          Assessment & Plan:

## 2016-08-04 NOTE — Assessment & Plan Note (Signed)
Mucinex DM, saline irrigation and allergy meds.  If not improving as expected.. Complete antibiotics.

## 2016-08-04 NOTE — Patient Instructions (Addendum)
Mucinex DM twice daily.  Continue flonase,  and Xyzal at bedtime.  Use nasal saline spray 2-3 times daily or rinse daily.  If not improving in next 3-4 days  Start antibiotics.  Call if not improving  As expected.     Consider allergist : Dr. Barnetta ChapelWhelan. [phone]

## 2016-09-27 ENCOUNTER — Ambulatory Visit (INDEPENDENT_AMBULATORY_CARE_PROVIDER_SITE_OTHER): Payer: BLUE CROSS/BLUE SHIELD | Admitting: Internal Medicine

## 2016-09-27 ENCOUNTER — Encounter: Payer: Self-pay | Admitting: Internal Medicine

## 2016-09-27 VITALS — BP 134/82 | HR 70 | Ht 64.0 in | Wt 167.0 lb

## 2016-09-27 DIAGNOSIS — R062 Wheezing: Secondary | ICD-10-CM

## 2016-09-27 DIAGNOSIS — R05 Cough: Secondary | ICD-10-CM | POA: Diagnosis not present

## 2016-09-27 DIAGNOSIS — R059 Cough, unspecified: Secondary | ICD-10-CM

## 2016-09-27 MED ORDER — METHYLPREDNISOLONE 4 MG PO TBPK: ORAL_TABLET | ORAL | 0 refills | Status: DC

## 2016-09-29 DIAGNOSIS — R05 Cough: Secondary | ICD-10-CM | POA: Insufficient documentation

## 2016-09-29 DIAGNOSIS — R059 Cough, unspecified: Secondary | ICD-10-CM | POA: Insufficient documentation

## 2016-09-29 NOTE — Assessment & Plan Note (Addendum)
Mild to mod,  For medrol po course, albuterol prn, to f/u any worsening symptoms or concerns

## 2016-09-29 NOTE — Patient Instructions (Signed)
Please take all new medication as prescribed - the antibiotic, cough medicine, and medrol  Please continue all other medications as before, and refills have been done if requested.  Please have the pharmacy call with any other refills you may need.  Please keep your appointments with your specialists as you may have planned

## 2016-09-29 NOTE — Assessment & Plan Note (Addendum)
Mild to mod, c/w bronchitis vs pna, for antibx course, for cxr, for cough med prn,  to f/u any worsening symptoms or concerns

## 2016-09-29 NOTE — Progress Notes (Signed)
Subjective:    Patient ID: Angela Chapman, female    DOB: April 15, 1950, 66 y.o.   MRN: [id number]  HPI  Here with acute onset mild to mod 2-3 days ST, HA, general weakness and malaise, with prod cough greenish sputum, but Pt denies chest pain, increased sob or doe, wheezing, orthopnea, PND, increased LE swelling, palpitations, dizziness or syncope, except for mild wheezing/sob since last PM. Pt denies new neurological symptoms such as new headache, or facial or extremity weakness or numbness   Pt denies polydipsia, polyuria Past Medical History:  Diagnosis Date  . Allergy   . Blood transfusion without reported diagnosis   . Broken ribs    fell and broke several ribs 12-23-12  . Depression    no per pt  . Fibromyalgia   . GERD (gastroesophageal reflux disease)    Past Surgical History:  Procedure Laterality Date  . ABDOMINAL HYSTERECTOMY    . COLONOSCOPY    . WRIST SURGERY     left    reports that she has never smoked. She has never used smokeless tobacco. She reports that she does not drink alcohol or use drugs. family history includes Arthritis in her mother; Cancer in her mother; Diabetes in her mother; Kidney disease in her mother; Stroke in her father. Allergies  Allergen Reactions  . Oxycodone-Acetaminophen     REACTION: rash  . Sulfonamide Derivatives     REACTION: Swelling   Current Outpatient Prescriptions on File Prior to Visit  Medication Sig Dispense Refill  . acetaminophen (TYLENOL) 325 MG tablet Take 650 mg by mouth every 6 (six) hours as needed.      . Alum Hydroxide-Mag Carbonate (GAVISCON PO) Take by mouth as needed.    Marland Kitchen. amitriptyline (ELAVIL) 10 MG tablet Take 1 tablet (10 mg total) by mouth at bedtime as needed. 90 tablet 1  . Calcium Carbonate-Vitamin D (CALCIUM 600+D) 600-400 MG-UNIT per tablet Take 1 tablet by mouth 2 (two) times daily.      . cyclobenzaprine (FLEXERIL) 10 MG tablet Take 10 mg by mouth at bedtime as needed.    . famotidine (PEPCID) 20 MG  tablet Take 20 mg by mouth 2 (two) times daily as needed.     . fluticasone (FLONASE) 50 MCG/ACT nasal spray Place 2 sprays into both nostrils daily. 16 g 11  . levocetirizine (XYZAL) 5 MG tablet Take 5 mg by mouth every evening.    . meloxicam (MOBIC) 15 MG tablet Take 15 mg by mouth daily as needed.    . naproxen (NAPROSYN) 500 MG tablet Take 1 tablet (500 mg total) by mouth 2 (two) times daily with a meal. 20 tablet 0  . triamcinolone cream (KENALOG) 0.1 % Apply 1 application topically 2 (two) times daily as needed.    . valACYclovir (VALTREX) 1000 MG tablet Take 1 tablet (1,000 mg total) by mouth as needed. 10 tablet 3   No current facility-administered medications on file prior to visit.    Review of Systems All other system neg per pt    Objective:   Physical Exam BP 134/82   Pulse 70   Ht 5\' 4"  (1.626 m)   Wt 167 lb (75.8 kg)   SpO2 99%   BMI 28.67 kg/m  VS noted, mild ill Constitutional: Pt appears in NAD HENT: Head: NCAT.  Right Ear: External ear normal.  Left Ear: External ear normal.  Bilat tm's with mild erythema.  Max sinus areas non tender.  Pharynx with mild erythema, no  exudate Eyes: . Pupils are equal, round, and reactive to light. Conjunctivae and EOM are normal Nose: without d/c or deformity Neck: Neck supple. Gross normal ROM Cardiovascular: Normal rate and regular rhythm.   Pulmonary/Chest: Effort normal and breath sounds decreased without rales but with few scattered wheezing.  Neurological: Pt is alert. At baseline orientation, motor grossly intact Skin: Skin is warm. No rashes, other new lesions, no LE edema Psychiatric: Pt behavior is normal without agitation  No other exam findings    Assessment & Plan:

## 2016-10-06 ENCOUNTER — Ambulatory Visit (INDEPENDENT_AMBULATORY_CARE_PROVIDER_SITE_OTHER)
Admission: RE | Admit: 2016-10-06 | Discharge: 2016-10-06 | Disposition: A | Discharge status: Home / Self Care | Payer: BLUE CROSS/BLUE SHIELD | Source: Ambulatory Visit | Attending: Internal Medicine | Admitting: Internal Medicine

## 2016-10-06 ENCOUNTER — Other Ambulatory Visit: Payer: Self-pay | Admitting: Internal Medicine

## 2016-10-06 ENCOUNTER — Telehealth: Payer: Self-pay | Admitting: Internal Medicine

## 2016-10-06 DIAGNOSIS — R05 Cough: Secondary | ICD-10-CM | POA: Diagnosis not present

## 2016-10-06 DIAGNOSIS — R059 Cough, unspecified: Secondary | ICD-10-CM

## 2016-10-06 MED ORDER — METHYLPREDNISOLONE 4 MG PO TBPK: ORAL_TABLET | ORAL | 0 refills | Status: DC

## 2016-10-06 MED ORDER — AZITHROMYCIN 250 MG PO TABS: ORAL_TABLET | ORAL | 1 refills | Status: DC

## 2016-10-06 NOTE — Telephone Encounter (Signed)
Pt has been informed and expressed understanding.  

## 2016-10-06 NOTE — Telephone Encounter (Signed)
cxr was negative for any acute pneumonia  But ok for antibx and repeat medrol pack

## 2016-10-06 NOTE — Telephone Encounter (Signed)
Pt called stating she her her Xray done today at [facility] and wanted to make sure it was looked at,  She would like to know what to do for the weekend, she is still wheezing and coughing and feels really weak at times and feels like passing out. She would like to knoe if she can get Antx refilled?  Please advise

## 2017-01-24 ENCOUNTER — Other Ambulatory Visit: Payer: Self-pay | Admitting: Family Medicine

## 2017-01-24 DIAGNOSIS — Z1231 Encounter for screening mammogram for malignant neoplasm of breast: Secondary | ICD-10-CM

## 2017-02-06 ENCOUNTER — Other Ambulatory Visit: Payer: Self-pay | Admitting: Family Medicine

## 2017-03-06 ENCOUNTER — Ambulatory Visit
Admission: RE | Admit: 2017-03-06 | Discharge: 2017-03-06 | Disposition: A | Discharge status: Home / Self Care | Payer: BLUE CROSS/BLUE SHIELD | Source: Ambulatory Visit | Attending: Family Medicine | Admitting: Family Medicine

## 2017-03-06 DIAGNOSIS — Z1231 Encounter for screening mammogram for malignant neoplasm of breast: Secondary | ICD-10-CM

## 2017-12-26 ENCOUNTER — Ambulatory Visit (INDEPENDENT_AMBULATORY_CARE_PROVIDER_SITE_OTHER): Payer: Medicare Other | Admitting: Family Medicine

## 2017-12-26 ENCOUNTER — Encounter (INDEPENDENT_AMBULATORY_CARE_PROVIDER_SITE_OTHER): Payer: Self-pay

## 2017-12-26 ENCOUNTER — Encounter: Payer: Self-pay | Admitting: Family Medicine

## 2017-12-26 VITALS — BP 136/78 | HR 82 | Temp 98.5°F | Ht 64.0 in | Wt 179.5 lb

## 2017-12-26 DIAGNOSIS — J019 Acute sinusitis, unspecified: Secondary | ICD-10-CM

## 2017-12-26 DIAGNOSIS — S2341XA Sprain of ribs, initial encounter: Secondary | ICD-10-CM | POA: Diagnosis not present

## 2017-12-26 DIAGNOSIS — B9689 Other specified bacterial agents as the cause of diseases classified elsewhere: Secondary | ICD-10-CM | POA: Diagnosis not present

## 2017-12-26 MED ORDER — AMOXICILLIN-POT CLAVULANATE 875-125 MG PO TABS: 1.0000 | ORAL_TABLET | Freq: Two times a day (BID) | ORAL | 0 refills | Status: AC

## 2017-12-26 MED ORDER — CYCLOBENZAPRINE HCL 10 MG PO TABS: 5.0000 mg | ORAL_TABLET | Freq: Two times a day (BID) | ORAL | 0 refills | Status: DC | PRN

## 2017-12-26 NOTE — Assessment & Plan Note (Signed)
Anticipate bacterial given duration and progression of symptoms. Treat with augmentin course. Push fluids and rest. Update if not improving with treatment.

## 2017-12-26 NOTE — Assessment & Plan Note (Signed)
Possibly from recent sinusitis, possibly from heavy lifting helping daughter on recent move. No splenomegaly or spleen tenderness to palpation. Supportive care reviewed -heating pad, flexeril with sedation precautions, ibuprofen. Update if not improving with treatment.

## 2017-12-26 NOTE — Progress Notes (Signed)
BP 136/78 (BP Location: Left Arm, Patient Position: Sitting, Cuff Size: Normal)   Pulse 82   Temp 98.5 F (36.9 C) (Oral)   Ht 5\' 4"  (1.626 m)   Wt 179 lb 8 oz (81.4 kg)   SpO2 98%   BMI 30.81 kg/m    CC: ST Subjective:    Patient ID: Angela Chapman, female    DOB: 29-Feb-1950, 67 y.o.   MRN: [id number]  HPI: Angela Chapman is a 67 y.o. female presenting on 12/26/2017 for Cough (C/o cough, sore throat and congestion for about 2 wks.) and Abdominal Pain (C/o LUQ abd pain for about 1 wk. Hurts to bend forward and to sleep on left side. H/o left side rib fx. )   10d h/o hoarseness, frontal sinus congestion, facial pressure, ST, fatigue. Mild non productive cough. PNdrainage. Stuffy muffled ears. Some dyspnea. Head and chest congestion.  LUQ abdominal pain that started 5d ago.   No fevers/chills, tooth pain. No nausea/vomiting, diarrhea/constipation.   Treating with tramadol without benefit. Tried some honey as well as essential oils. Has been taking xyzal and chlortaz and night cough medicine.  Recently in CT helping daughter move.  No h/o asthma.  No smokers at home.  Known allergic rhinitis.   Relevant past medical, surgical, family and social history reviewed and updated as indicated. Interim medical history since our last visit reviewed. Allergies and medications reviewed and updated. Outpatient Medications Prior to Visit  Medication Sig Dispense Refill  . acetaminophen (TYLENOL) 325 MG tablet Take 650 mg by mouth every 6 (six) hours as needed.      Marland Kitchen amitriptyline (ELAVIL) 10 MG tablet Take 1 tablet (10 mg total) by mouth at bedtime as needed. 90 tablet 1  . Calcium Carbonate-Vitamin D (CALCIUM 600+D) 600-400 MG-UNIT per tablet Take 1 tablet by mouth 2 (two) times daily.      . famotidine (PEPCID) 20 MG tablet Take 20 mg by mouth 2 (two) times daily as needed.     . fluticasone (FLONASE) 50 MCG/ACT nasal spray PLACE 2 SPRAYS INTO BOTH NOSTRILS DAILY. (Patient taking differently:  Place 2 sprays into both nostrils daily. As needed) 16 g 2  . levocetirizine (XYZAL) 5 MG tablet Take 5 mg by mouth every evening.    . meloxicam (MOBIC) 15 MG tablet Take 15 mg by mouth daily as needed.    . triamcinolone cream (KENALOG) 0.1 % Apply 1 application topically 2 (two) times daily as needed.    . cyclobenzaprine (FLEXERIL) 10 MG tablet Take 10 mg by mouth at bedtime as needed.    . Alum Hydroxide-Mag Carbonate (GAVISCON PO) Take by mouth as needed.    Marland Kitchen azithromycin (ZITHROMAX Z-PAK) 250 MG tablet 2 tab by mouth day1, then 1 per day 6 tablet 1  . methylPREDNISolone (MEDROL DOSEPAK) 4 MG TBPK tablet 3tab by mouth x 3 day,2tab x 3 day,1tab x 5 days 21 tablet 0  . naproxen (NAPROSYN) 500 MG tablet Take 1 tablet (500 mg total) by mouth 2 (two) times daily with a meal. 20 tablet 0  . valACYclovir (VALTREX) 1000 MG tablet Take 1 tablet (1,000 mg total) by mouth as needed. 10 tablet 3   No facility-administered medications prior to visit.      Per HPI unless specifically indicated in ROS section below Review of Systems     Objective:    BP 136/78 (BP Location: Left Arm, Patient Position: Sitting, Cuff Size: Normal)   Pulse 82   Temp 98.5 F (  36.9 C) (Oral)   Ht 5\' 4"  (1.626 m)   Wt 179 lb 8 oz (81.4 kg)   SpO2 98%   BMI 30.81 kg/m   Wt Readings from Last 3 Encounters:  12/26/17 179 lb 8 oz (81.4 kg)  09/27/16 167 lb (75.8 kg)  08/04/16 172 lb (78 kg)    Physical Exam  Constitutional: She appears well-developed and well-nourished. No distress.  HENT:  Head: Normocephalic and atraumatic.  Right Ear: Hearing, tympanic membrane, external ear and ear canal normal.  Left Ear: Hearing, tympanic membrane, external ear and ear canal normal.  Nose: Mucosal edema and rhinorrhea present. Right sinus exhibits no maxillary sinus tenderness and no frontal sinus tenderness. Left sinus exhibits no maxillary sinus tenderness and no frontal sinus tenderness.  Mouth/Throat: Uvula is  midline and mucous membranes are normal. Posterior oropharyngeal erythema present. No oropharyngeal exudate, posterior oropharyngeal edema or tonsillar abscesses.  Eyes: Pupils are equal, round, and reactive to light. Conjunctivae and EOM are normal. No scleral icterus.  Neck: Normal range of motion. Neck supple.  Cardiovascular: Normal rate, regular rhythm, normal heart sounds and intact distal pulses.  No murmur heard. Pulmonary/Chest: Effort normal and breath sounds normal. No respiratory distress. She has no wheezes. She has no rales. She exhibits tenderness.  Tender to palpation anterior lower ribcage    Abdominal: Soft. Bowel sounds are normal. She exhibits no distension and no mass. There is no hepatosplenomegaly. There is no tenderness. There is no rebound and no guarding. No hernia.  Lymphadenopathy:    She has no cervical adenopathy.  Skin: Skin is warm and dry. No rash noted.  Nursing note and vitals reviewed.     Assessment & Plan:  Over 25 minutes were spent face-to-face with the patient during this encounter and >50% of that time was spent on counseling and coordination of care  Problem List Items Addressed This Visit    Rib sprain, initial encounter    Possibly from recent sinusitis, possibly from heavy lifting helping daughter on recent move. No splenomegaly or spleen tenderness to palpation. Supportive care reviewed -heating pad, flexeril with sedation precautions, ibuprofen. Update if not improving with treatment.        Acute bacterial sinusitis - Primary    Anticipate bacterial given duration and progression of symptoms. Treat with augmentin course. Push fluids and rest. Update if not improving with treatment.       Relevant Medications   amoxicillin-clavulanate (AUGMENTIN) 875-125 MG tablet       Meds ordered this encounter  Medications  . cyclobenzaprine (FLEXERIL) 10 MG tablet    Sig: Take 0.5-1 tablets (5-10 mg total) by mouth 2 (two) times daily as needed  for muscle spasms (sedation precautions).    Dispense:  20 tablet    Refill:  0  . amoxicillin-clavulanate (AUGMENTIN) 875-125 MG tablet    Sig: Take 1 tablet by mouth 2 (two) times daily for 10 days.    Dispense:  20 tablet    Refill:  0   No orders of the defined types were placed in this encounter.   Follow up plan: Return if symptoms worsen or fail to improve.  Eustaquio Boyden, MD

## 2017-12-26 NOTE — Patient Instructions (Addendum)
You have a sinus infection, likely bacterial. Take medicine as prescribed: augmentin 10 day course.  Push fluids and plenty of rest. Nasal saline irrigation or neti pot to help drain sinuses. May use plain mucinex with plenty of fluid to help mobilize mucous. Watch for fever >101, worsening productive cough, or not improving as expected For rib strain - may do heating pad and ibuprofen as needed. Try flexeril muscle relaxant sent to pharmacy.

## 2018-02-05 DIAGNOSIS — Z23 Encounter for immunization: Secondary | ICD-10-CM | POA: Diagnosis not present

## 2018-02-22 ENCOUNTER — Telehealth: Payer: Self-pay | Admitting: Family Medicine

## 2018-02-22 DIAGNOSIS — E7800 Pure hypercholesterolemia, unspecified: Secondary | ICD-10-CM

## 2018-02-22 DIAGNOSIS — M810 Age-related osteoporosis without current pathological fracture: Secondary | ICD-10-CM

## 2018-02-22 NOTE — Telephone Encounter (Signed)
-----   Message from Alvina Chou sent at 02/18/2018 10:06 AM EST ----- Regarding: Lab orders for Monday, 1.6.20 Patient is scheduled for CPX labs, please order future labs, Thanks , Camelia Eng

## 2018-02-25 ENCOUNTER — Other Ambulatory Visit (INDEPENDENT_AMBULATORY_CARE_PROVIDER_SITE_OTHER): Payer: Medicare Other

## 2018-02-25 DIAGNOSIS — E7800 Pure hypercholesterolemia, unspecified: Secondary | ICD-10-CM | POA: Diagnosis not present

## 2018-02-25 LAB — LIPID PANEL
CHOL/HDL RATIO: 2
Cholesterol: 180 mg/dL (ref 0–200)
HDL: 76 mg/dL (ref >39.00)
LDL CALC: 93 mg/dL (ref 0–99)
NonHDL: 104.22
TRIGLYCERIDES: 57 mg/dL (ref 0.0–149.0)
VLDL: 11.4 mg/dL (ref 0.0–40.0)

## 2018-02-25 LAB — COMPREHENSIVE METABOLIC PANEL
ALT: 27 U/L (ref 0–35)
AST: 26 U/L (ref 0–37)
Albumin: 4 g/dL (ref 3.5–5.2)
Alkaline Phosphatase: 74 U/L (ref 39–117)
BUN: 14 mg/dL (ref 6–23)
CALCIUM: 9.5 mg/dL (ref 8.4–10.5)
CHLORIDE: 104 meq/L (ref 96–112)
CO2: 32 meq/L (ref 19–32)
CREATININE: 0.73 mg/dL (ref 0.40–1.20)
GFR: 84.97 mL/min (ref >60.00)
Glucose, Bld: 92 mg/dL (ref 70–99)
Potassium: 4.2 mEq/L (ref 3.5–5.1)
Sodium: 140 mEq/L (ref 135–145)
Total Bilirubin: 0.4 mg/dL (ref 0.2–1.2)
Total Protein: 6.6 g/dL (ref 6.0–8.3)

## 2018-03-01 ENCOUNTER — Ambulatory Visit (INDEPENDENT_AMBULATORY_CARE_PROVIDER_SITE_OTHER): Payer: Medicare Other | Admitting: Family Medicine

## 2018-03-01 ENCOUNTER — Encounter: Payer: Self-pay | Admitting: Family Medicine

## 2018-03-01 VITALS — BP 101/70 | HR 71 | Temp 97.9°F | Ht 64.0 in | Wt 180.8 lb

## 2018-03-01 DIAGNOSIS — Z0000 Encounter for general adult medical examination without abnormal findings: Secondary | ICD-10-CM | POA: Diagnosis not present

## 2018-03-01 DIAGNOSIS — F325 Major depressive disorder, single episode, in full remission: Secondary | ICD-10-CM | POA: Diagnosis not present

## 2018-03-01 DIAGNOSIS — Z1231 Encounter for screening mammogram for malignant neoplasm of breast: Secondary | ICD-10-CM

## 2018-03-01 DIAGNOSIS — E7800 Pure hypercholesterolemia, unspecified: Secondary | ICD-10-CM

## 2018-03-01 DIAGNOSIS — M810 Age-related osteoporosis without current pathological fracture: Secondary | ICD-10-CM | POA: Diagnosis not present

## 2018-03-01 DIAGNOSIS — Z23 Encounter for immunization: Secondary | ICD-10-CM

## 2018-03-01 NOTE — Assessment & Plan Note (Signed)
Chol at goal on no medication. CVD 10 year risk: 4.1% Statin not indicated.

## 2018-03-01 NOTE — Patient Instructions (Addendum)
Keep up exercise.. .150 min per week.  We will call with bone density and mammogram appointments.

## 2018-03-01 NOTE — Progress Notes (Signed)
Subjective:    Patient ID: Angela Chapman, female    DOB: 11-18-1950, 68 y.o.   MRN: [id number]  HPI  68 year old female presents for Welcome to Medicare visit, complete physical and review of chronic health problems. He/She also has the following acute concerns today: none  Cough and congestion in 10-12/2017 now resolved.  Elevated Cholesterol:  Chol at goal on no medication. CVD 10 year risk: 4.1% Statin not indicated. Lab Results  Component Value Date   CHOL 180 02/25/2018   HDL 76.00 02/25/2018   LDLCALC 93 02/25/2018   TRIG 57.0 02/25/2018   CHOLHDL 2 02/25/2018  Using medications without problems:none Muscle aches: none Diet compliance: good Exercise: 2-4 times a week Other complaints:  Wt Readings from Last 3 Encounters:  03/01/18 180 lb 12 oz (82 kg)  12/26/17 179 lb 8 oz (81.4 kg)  09/27/16 167 lb (75.8 kg)    Depression, in remission Depression screen [facility] 2/9 03/01/2018  Decreased Interest 0  Down, Depressed, Hopeless 0  PHQ - 2 Score 0   Allergies well controlled on Xyzal and flonase.   Advance directives and end of life planning reviewed in detail with patient and documented in EMR. Patient given handout on advance care directives if needed. HCPOA and living will updated if needed.   Hearing Screening   Method: Audiometry   125Hz  250Hz  500Hz  1000Hz  2000Hz  3000Hz  4000Hz  6000Hz  8000Hz   Right ear:   20 20 15   0    Left ear:   20 20 25   40    Vision Screening Comments: Wears Glasses-Eye Exam with Dr. Seymour Bars Summer of 2019  Fall Risk  03/01/2018  Falls in the past year? 0     Social History /Family History/Past Medical History reviewed in detail and updated in EMR if needed. Blood pressure 101/70, pulse 71, temperature 97.9 F (36.6 C), temperature source Oral, height 5\' 4"  (1.626 m), weight 180 lb 12 oz (82 kg).  Review of Systems  Constitutional: Negative for fatigue and fever.  HENT: Negative for congestion.   Eyes: Negative for pain.  Respiratory:  Negative for cough and shortness of breath.   Cardiovascular: Negative for chest pain, palpitations and leg swelling.  Gastrointestinal: Negative for abdominal pain.  Genitourinary: Negative for dysuria and vaginal bleeding.  Musculoskeletal: Negative for back pain.  Neurological: Negative for syncope, light-headedness and headaches.  Psychiatric/Behavioral: Negative for dysphoric mood.       Objective:   Physical Exam Constitutional:      General: She is not in acute distress.    Appearance: Normal appearance. She is well-developed. She is not ill-appearing or toxic-appearing.  HENT:     Head: Normocephalic.     Right Ear: Hearing, tympanic membrane, ear canal and external ear normal.     Left Ear: Hearing, tympanic membrane, ear canal and external ear normal.     Nose: Nose normal.  Eyes:     General: Lids are normal. Lids are everted, no foreign bodies appreciated.     Conjunctiva/sclera: Conjunctivae normal.     Pupils: Pupils are equal, round, and reactive to light.  Neck:     Musculoskeletal: Normal range of motion and neck supple.     Thyroid: No thyroid mass or thyromegaly.     Vascular: No carotid bruit.     Trachea: Trachea normal.  Cardiovascular:     Rate and Rhythm: Normal rate and regular rhythm.     Heart sounds: Normal heart sounds, S1 normal and S2  normal. No murmur. No gallop.   Pulmonary:     Effort: Pulmonary effort is normal. No respiratory distress.     Breath sounds: Normal breath sounds. No wheezing, rhonchi or rales.  Abdominal:     General: Bowel sounds are normal. There is no distension or abdominal bruit.     Palpations: Abdomen is soft. There is no fluid wave or mass.     Tenderness: There is no abdominal tenderness. There is no guarding or rebound.     Hernia: No hernia is present.  Lymphadenopathy:     Cervical: No cervical adenopathy.  Skin:    General: Skin is warm and dry.     Findings: No rash.  Neurological:     Mental Status: She is  alert.     Cranial Nerves: No cranial nerve deficit.     Sensory: No sensory deficit.  Psychiatric:        Mood and Affect: Mood is not anxious or depressed.        Speech: Speech normal.        Behavior: Behavior normal. Behavior is cooperative.        Judgment: Judgment normal.           Assessment & Plan:  The patient's preventative maintenance and recommended screening tests for an annual wellness exam were reviewed in full today. Brought up to date unless services declined.  Counselled on the importance of diet, exercise, and its role in overall health and mortality. The patient's FH and SH was reviewed, including their home life, tobacco status, and drug and alcohol status.   Vaccines: uptodate except zostavax, and prevnar Pap/DVE:  Not indicated TAH Mammo: 02/2017, repeat q2 years Bone Density: osteoporosis improved 10/2011: Recheck in 2018 DUE NOW Colon: 2015 Dr. Christella Hartigan, nml, repeat in 10 years..  Smoking Status: none HIV screen:  refused. Hep C: negative

## 2018-03-01 NOTE — Assessment & Plan Note (Signed)
Stable on no medication. 

## 2018-03-01 NOTE — Addendum Note (Signed)
Addended by: Damita LackLORING, DONNA S on: 03/01/2018 03:52 PM   Modules accepted: Orders

## 2018-03-05 ENCOUNTER — Other Ambulatory Visit: Payer: Self-pay | Admitting: Family Medicine

## 2018-03-15 ENCOUNTER — Telehealth: Payer: Self-pay | Admitting: Family Medicine

## 2018-03-15 NOTE — Telephone Encounter (Signed)
Order has been change.

## 2018-03-15 NOTE — Addendum Note (Signed)
Addended by: Damita Lack on: 03/15/2018 10:19 AM   Modules accepted: Orders

## 2018-03-15 NOTE — Telephone Encounter (Signed)
Lupita Leash Can you change mammogram to img [id number] Thanks

## 2018-07-11 ENCOUNTER — Other Ambulatory Visit: Payer: Medicare Other

## 2018-07-17 ENCOUNTER — Other Ambulatory Visit: Payer: Medicare Other

## 2018-08-13 ENCOUNTER — Other Ambulatory Visit: Payer: Self-pay | Admitting: Family Medicine

## 2018-09-03 ENCOUNTER — Other Ambulatory Visit: Payer: Medicare Other

## 2018-10-25 ENCOUNTER — Other Ambulatory Visit: Payer: Self-pay | Admitting: *Deleted

## 2018-10-25 DIAGNOSIS — Z20822 Contact with and (suspected) exposure to covid-19: Secondary | ICD-10-CM

## 2018-10-25 DIAGNOSIS — R6889 Other general symptoms and signs: Secondary | ICD-10-CM | POA: Diagnosis not present

## 2018-10-27 LAB — NOVEL CORONAVIRUS, NAA: SARS-CoV-2, NAA: NOT DETECTED

## 2018-12-18 DIAGNOSIS — Z23 Encounter for immunization: Secondary | ICD-10-CM | POA: Diagnosis not present

## 2018-12-27 ENCOUNTER — Other Ambulatory Visit: Payer: Self-pay | Admitting: Family Medicine

## 2019-01-13 IMAGING — DX DG CHEST 2V
2 series · 2 of 2 positions shown · non-contrast
Comparison: 08/02/2015

CLINICAL DATA: Cough

EXAM:
CHEST  2 VIEW

[chest pa]
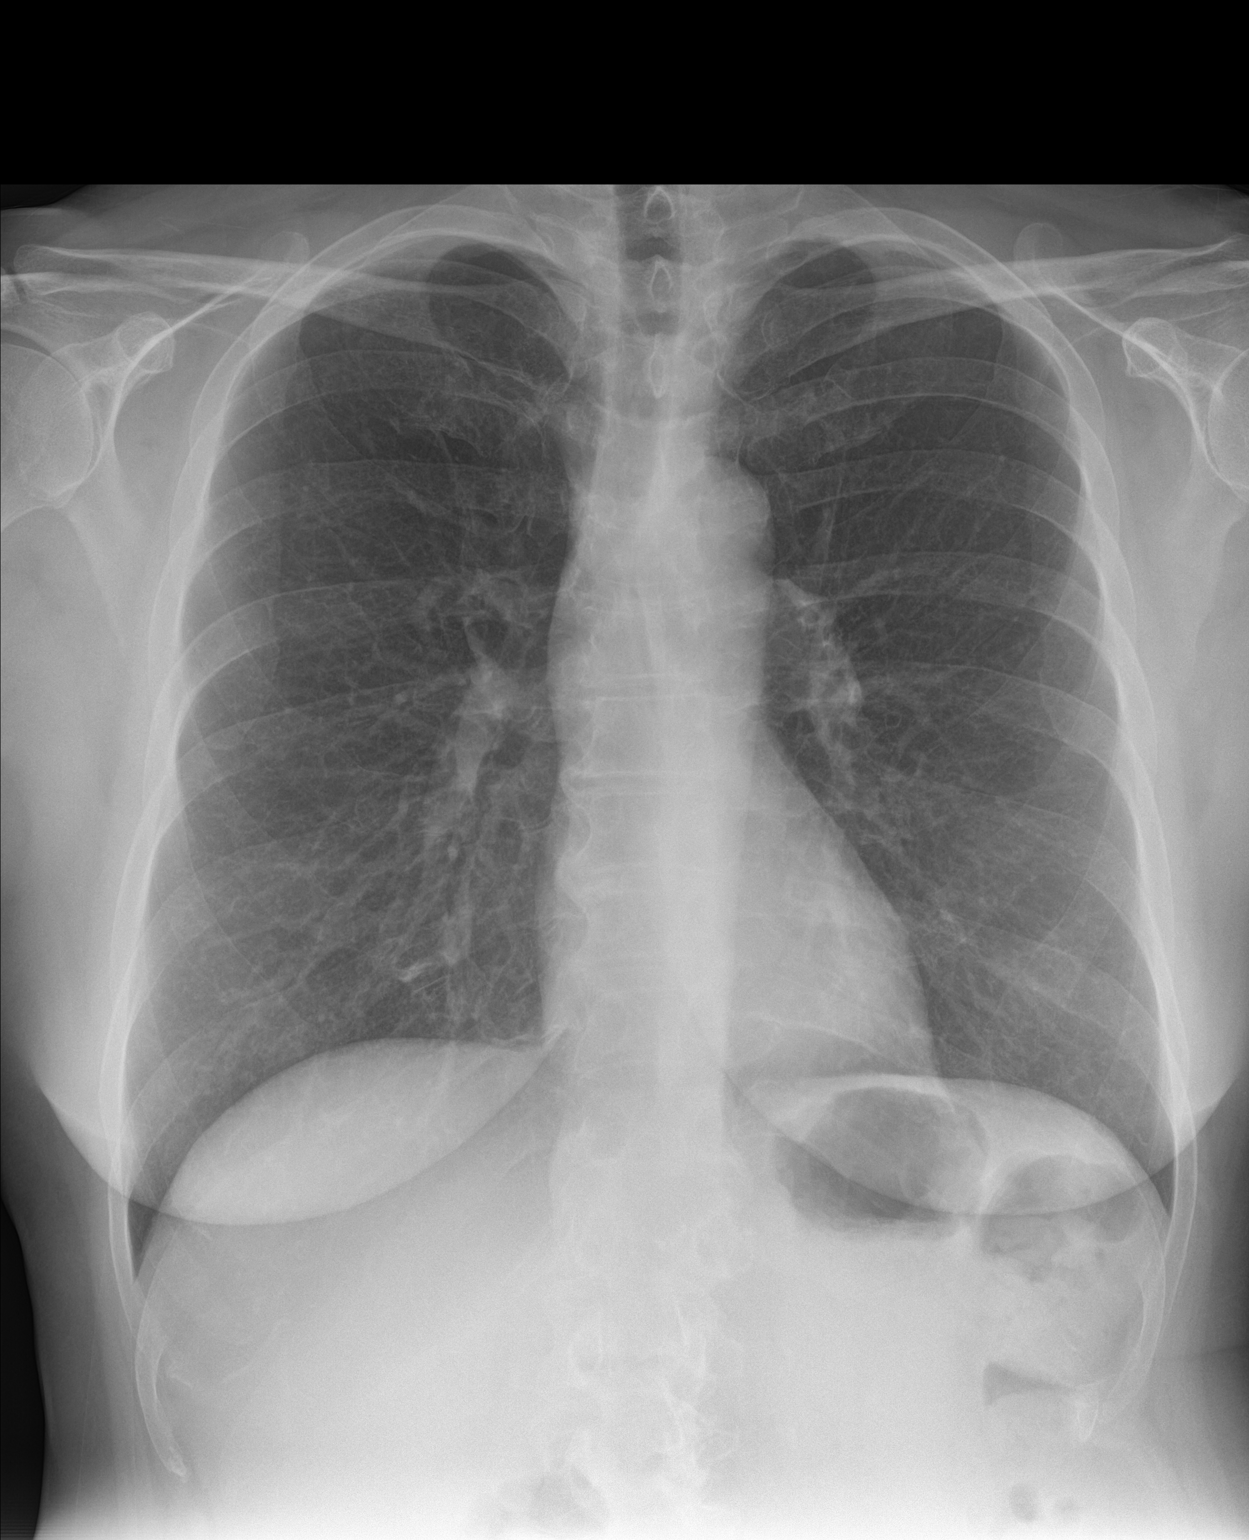

[chest lat]
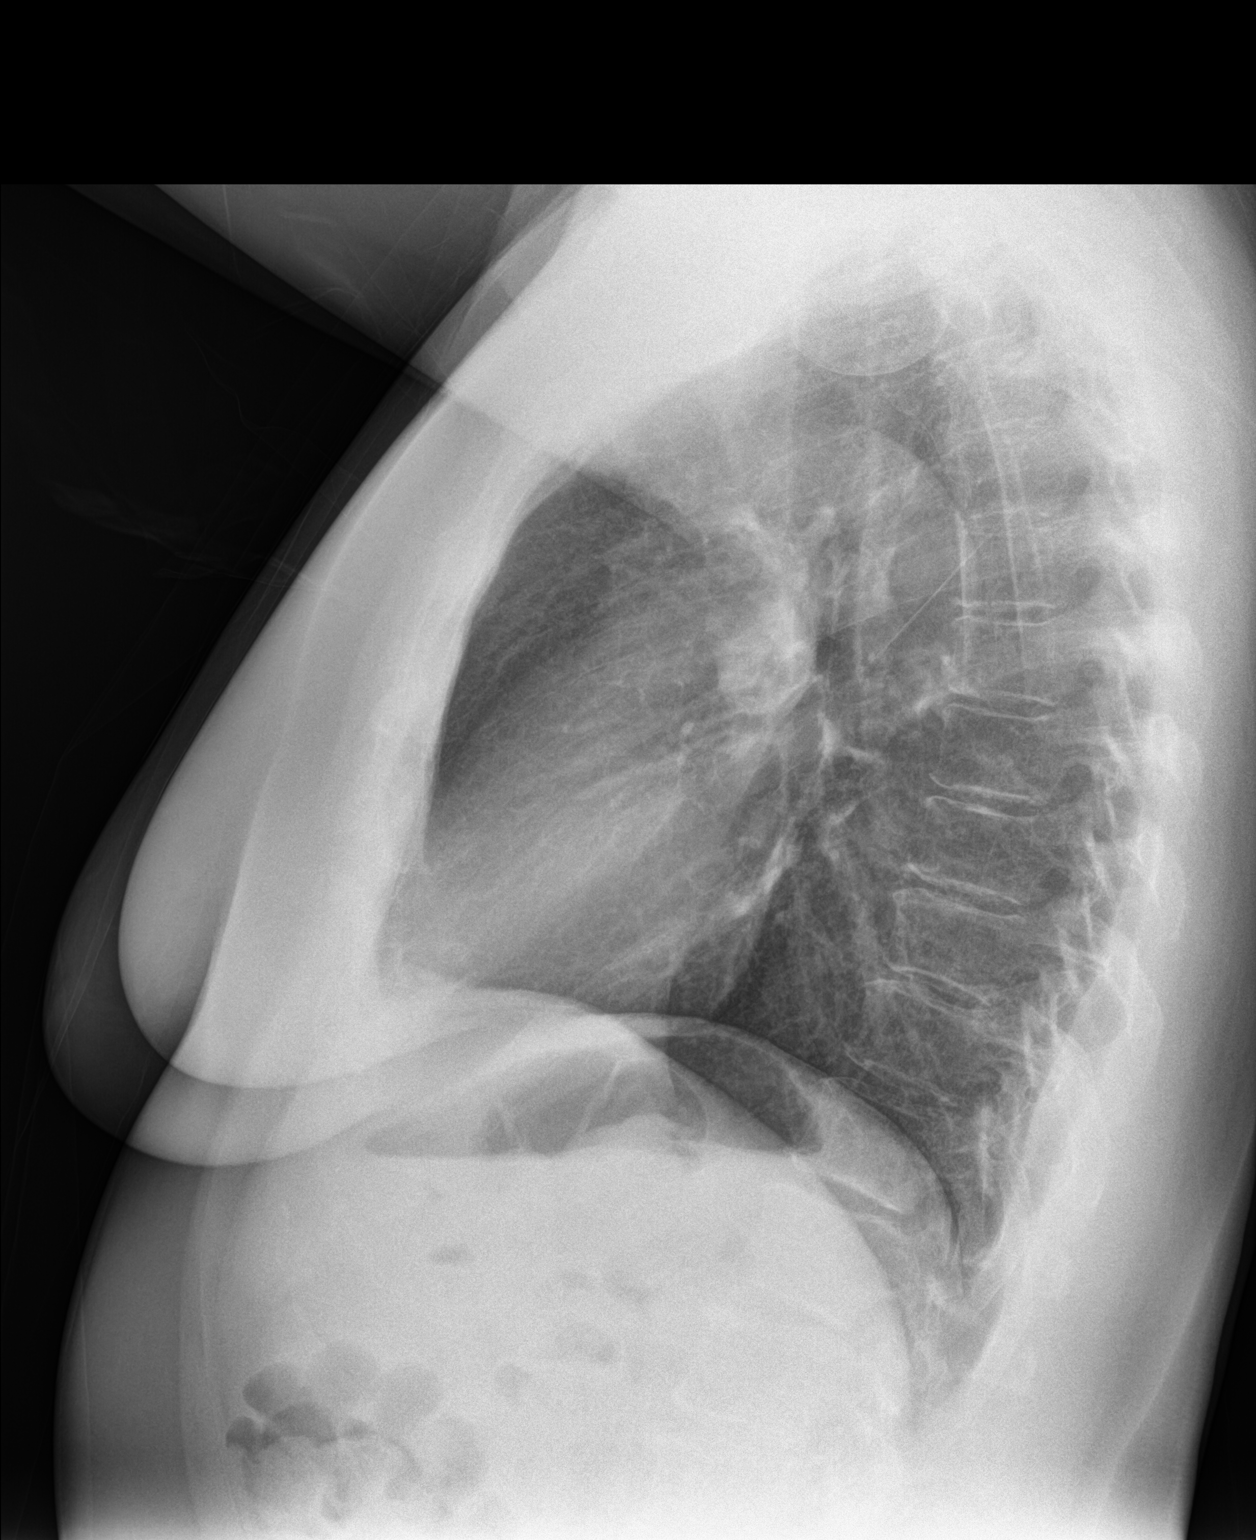

[2 of 2 positions shown; findings below may reference images not displayed]

FINDINGS: The heart size and mediastinal contours are within normal limits.
Both lungs are clear. The visualized skeletal structures are
unremarkable.
IMPRESSION: No active cardiopulmonary disease.

## 2019-03-18 ENCOUNTER — Telehealth: Payer: Self-pay

## 2019-03-18 DIAGNOSIS — H2513 Age-related nuclear cataract, bilateral: Secondary | ICD-10-CM | POA: Diagnosis not present

## 2019-03-18 NOTE — Telephone Encounter (Signed)
LVM w COVID screen and back lab info 1.26.2021 TLJ 

## 2019-03-20 ENCOUNTER — Other Ambulatory Visit: Payer: Self-pay

## 2019-03-20 ENCOUNTER — Other Ambulatory Visit (INDEPENDENT_AMBULATORY_CARE_PROVIDER_SITE_OTHER): Payer: PPO

## 2019-03-20 ENCOUNTER — Telehealth: Payer: Self-pay | Admitting: Family Medicine

## 2019-03-20 DIAGNOSIS — M810 Age-related osteoporosis without current pathological fracture: Secondary | ICD-10-CM

## 2019-03-20 DIAGNOSIS — E7800 Pure hypercholesterolemia, unspecified: Secondary | ICD-10-CM | POA: Diagnosis not present

## 2019-03-20 LAB — COMPREHENSIVE METABOLIC PANEL
ALT: 17 U/L (ref 0–35)
AST: 21 U/L (ref 0–37)
Albumin: 4.1 g/dL (ref 3.5–5.2)
Alkaline Phosphatase: 78 U/L (ref 39–117)
BUN: 14 mg/dL (ref 6–23)
CO2: 31 mEq/L (ref 19–32)
Calcium: 9.3 mg/dL (ref 8.4–10.5)
Chloride: 105 mEq/L (ref 96–112)
Creatinine, Ser: 0.78 mg/dL (ref 0.40–1.20)
GFR: 73.82 mL/min (ref >60.00)
Glucose, Bld: 92 mg/dL (ref 70–99)
Potassium: 4.5 mEq/L (ref 3.5–5.1)
Sodium: 141 mEq/L (ref 135–145)
Total Bilirubin: 0.6 mg/dL (ref 0.2–1.2)
Total Protein: 6.3 g/dL (ref 6.0–8.3)

## 2019-03-20 LAB — LIPID PANEL
Cholesterol: 188 mg/dL (ref 0–200)
HDL: 74.3 mg/dL (ref >39.00)
LDL Cholesterol: 101 mg/dL — ABNORMAL HIGH (ref 0–99)
NonHDL: 113.92
Total CHOL/HDL Ratio: 3
Triglycerides: 64 mg/dL (ref 0.0–149.0)
VLDL: 12.8 mg/dL (ref 0.0–40.0)

## 2019-03-20 LAB — VITAMIN D 25 HYDROXY (VIT D DEFICIENCY, FRACTURES): VITD: 30.9 ng/mL (ref 30.00–100.00)

## 2019-03-20 NOTE — Telephone Encounter (Signed)
-----   Message from Alvina Chou sent at 03/07/2019  3:27 PM EST ----- Regarding: Lab orders for Thursday, 1.28.21  AWV lab orders, please.

## 2019-03-21 NOTE — Progress Notes (Signed)
No critical labs need to be addressed urgently. We will discuss labs in detail at upcoming office visit.   

## 2019-03-27 ENCOUNTER — Encounter: Payer: Self-pay | Admitting: Family Medicine

## 2019-03-27 ENCOUNTER — Ambulatory Visit (INDEPENDENT_AMBULATORY_CARE_PROVIDER_SITE_OTHER): Payer: PPO | Admitting: Family Medicine

## 2019-03-27 ENCOUNTER — Other Ambulatory Visit: Payer: Self-pay

## 2019-03-27 VITALS — BP 122/64 | HR 83 | Temp 97.4°F | Ht 64.0 in | Wt 173.8 lb

## 2019-03-27 DIAGNOSIS — F325 Major depressive disorder, single episode, in full remission: Secondary | ICD-10-CM | POA: Diagnosis not present

## 2019-03-27 DIAGNOSIS — M810 Age-related osteoporosis without current pathological fracture: Secondary | ICD-10-CM | POA: Diagnosis not present

## 2019-03-27 DIAGNOSIS — Z0000 Encounter for general adult medical examination without abnormal findings: Secondary | ICD-10-CM

## 2019-03-27 DIAGNOSIS — Z1231 Encounter for screening mammogram for malignant neoplasm of breast: Secondary | ICD-10-CM

## 2019-03-27 DIAGNOSIS — J3089 Other allergic rhinitis: Secondary | ICD-10-CM | POA: Diagnosis not present

## 2019-03-27 DIAGNOSIS — E7800 Pure hypercholesterolemia, unspecified: Secondary | ICD-10-CM

## 2019-03-27 MED ORDER — AZELASTINE HCL 0.1 % NA SOLN: 2.0000 | Freq: Two times a day (BID) | NASAL | 12 refills | Status: DC

## 2019-03-27 NOTE — Assessment & Plan Note (Signed)
Due for re-eval. 

## 2019-03-27 NOTE — Assessment & Plan Note (Signed)
5.2% 10 year risk for CVD, no indication for statin.

## 2019-03-27 NOTE — Assessment & Plan Note (Addendum)
Stop flonase given irritation. Will try trial of astelin.

## 2019-03-27 NOTE — Progress Notes (Signed)
Chief Complaint  Patient presents with  . Medicare Wellness    History of Present Illness: HPI  The patient presents for annual medicare wellness, complete physical and review of chronic health problems. He/She also has the following acute concerns today:  I have personally reviewed the Medicare Annual Wellness questionnaire and have noted 1. The patient's medical and social history 2. Their use of alcohol, tobacco or illicit drugs 3. Their current medications and supplements 4. The patient's functional ability including ADL's, fall risks, home safety risks and hearing or visual             impairment. 5. Diet and physical activities 6. Evidence for depression or mood disorders 7.         Updated provider list Cognitive evaluation was performed and recorded on pt medicare questionnaire form. The patients weight, height, BMI and visual acuity have been recorded in the chart  I have made referrals, counseling and provided education to the patient based review of the above and I have provided the pt with a written personalized care plan for preventive services.   Documentation of this information was scanned into the electronic record under the media tab.   Hearing Screening   125Hz  250Hz  500Hz  1000Hz  2000Hz  3000Hz  4000Hz  6000Hz  8000Hz   Right ear:   25 25 25   40    Left ear:   25 25 25  25     Vision Screening Comments: Last eye exam 03/18/2019  @ [facility] directives and end of life planning reviewed in detail with patient and documented in EMR. Patient given handout on advance care directives if needed. HCPOA and living will updated if needed.  Depression screen [facility] 2/9 03/27/2019 03/01/2018  Decreased Interest 0 0  Down, Depressed, Hopeless 0 0  PHQ - 2 Score 0 0   Fall Risk  03/27/2019 03/01/2018  Falls in the past year? 0 0   MDD, well controlled, even better now back on vit D.    Wt Readings from Last 3 Encounters:  03/27/19 173 lb 12.8 oz (78.8  kg)  03/01/18 180 lb 12 oz (82 kg)  12/26/17 179 lb 8 oz (81.4 kg)     Elevated Cholesterol:  5.2% 10 year risk for CVD, no indication for statin. Lab Results  Component Value Date   CHOL 188 03/20/2019   HDL 74.30 03/20/2019   LDLCALC 101 (H) 03/20/2019   TRIG 64.0 03/20/2019   CHOLHDL 3 03/20/2019  Using medications without problems: Muscle aches:  Diet compliance: Low carb Exercise: [place] .. in silver sneakers Other complaints:  Allergies well controlled on Xyzal  but flonase causes nasal sores..   Vit D in nml range on  supplements This visit occurred during the SARS-CoV-2 public health emergency.  Safety protocols were in place, including screening questions prior to the visit, additional usage of staff PPE, and extensive cleaning of exam room while observing appropriate contact time as indicated for disinfecting solutions.   COVID 19 screen:  No recent travel or known exposure to COVID19 The patient denies respiratory symptoms of COVID 19 at this time. The importance of social distancing was discussed today.     Review of Systems  Constitutional: Negative for chills and fever.  HENT: Negative for congestion and ear pain.   Eyes: Negative for pain and redness.  Respiratory: Negative for cough and shortness of breath.   Cardiovascular: Negative for chest pain, palpitations and leg swelling.  Gastrointestinal: Negative for abdominal  pain, blood in stool, constipation, diarrhea, nausea and vomiting.  Genitourinary: Negative for dysuria.  Musculoskeletal: Negative for falls and myalgias.  Skin: Negative for rash.  Neurological: Negative for dizziness.  Psychiatric/Behavioral: Negative for depression. The patient is not nervous/anxious.       Past Medical History:  Diagnosis Date  . Allergy   . Blood transfusion without reported diagnosis   . Broken ribs    fell and broke several ribs 12-23-12  . Depression    no per pt  . Fibromyalgia   . GERD  (gastroesophageal reflux disease)     reports that she has never smoked. She has never used smokeless tobacco. She reports that she does not drink alcohol or use drugs.   Current Outpatient Medications:  .  Calcium Carbonate-Vitamin D (CALCIUM 600+D) 600-400 MG-UNIT per tablet, Take 1 tablet by mouth 2 (two) times daily.  , Disp: , Rfl:  .  fluticasone (FLONASE) 50 MCG/ACT nasal spray, PLACE 2 SPRAYS INTO BOTH NOSTRILS DAILY. AS NEEDED, Disp: 16 mL, Rfl: 2 .  levocetirizine (XYZAL) 5 MG tablet, Take 5 mg by mouth every evening., Disp: , Rfl:  .  triamcinolone cream (KENALOG) 0.1 %, Apply 1 application topically 2 (two) times daily as needed., Disp: , Rfl:    Observations/Objective: Blood pressure 122/64, pulse 83, temperature (!) 97.4 F (36.3 C), height 5\' 4"  (1.626 m), weight 173 lb 12.8 oz (78.8 kg), SpO2 97 %.  Physical Exam Constitutional:      General: She is not in acute distress.    Appearance: Normal appearance. She is well-developed. She is not ill-appearing or toxic-appearing.  HENT:     Head: Normocephalic.     Right Ear: Hearing, tympanic membrane, ear canal and external ear normal.     Left Ear: Hearing, tympanic membrane, ear canal and external ear normal.     Nose: Nose normal.  Eyes:     General: Lids are normal. Lids are everted, no foreign bodies appreciated.     Conjunctiva/sclera: Conjunctivae normal.     Pupils: Pupils are equal, round, and reactive to light.  Neck:     Thyroid: No thyroid mass or thyromegaly.     Vascular: No carotid bruit.     Trachea: Trachea normal.  Cardiovascular:     Rate and Rhythm: Normal rate and regular rhythm.     Heart sounds: Normal heart sounds, S1 normal and S2 normal. No murmur. No gallop.   Pulmonary:     Effort: Pulmonary effort is normal. No respiratory distress.     Breath sounds: Normal breath sounds. No wheezing, rhonchi or rales.  Abdominal:     General: Bowel sounds are normal. There is no distension or abdominal  bruit.     Palpations: Abdomen is soft. There is no fluid wave or mass.     Tenderness: There is no abdominal tenderness. There is no guarding or rebound.     Hernia: No hernia is present.  Musculoskeletal:     Cervical back: Normal range of motion and neck supple.  Lymphadenopathy:     Cervical: No cervical adenopathy.  Skin:    General: Skin is warm and dry.     Findings: No rash.  Neurological:     Mental Status: She is alert.     Cranial Nerves: No cranial nerve deficit.     Sensory: No sensory deficit.  Psychiatric:        Mood and Affect: Mood is not anxious or depressed.  Speech: Speech normal.        Behavior: Behavior normal. Behavior is cooperative.        Judgment: Judgment normal.      Assessment and Plan   The patient's preventative maintenance and recommended screening tests for an annual wellness exam were reviewed in full today. Brought up to date unless services declined.  Counselled on the importance of diet, exercise, and its role in overall health and mortality. The patient's FH and SH was reviewed, including their home life, tobacco status, and drug and alcohol status.   Vaccines:uptodate except zostavax, and due for pneumovax today. Encouraged COVID vaccine. Pap/DVE:Not indicated TAH Mammo:02/2017, repeat q2 years Bone Density:osteoporosis improved 10/2011: OVERDUE NOW Colon:2015Dr. Ardis Hughs, nml, repeat in 10 years.. Smoking Status:none HIV screen:refused. Hep C: negative  Depression, major, in remission ([place])  Good control on no med.  Allergic rhinitis  Stop flonase given irritation. Will try trial of astelin.   PURE HYPERCHOLESTEROLEMIA  5.2% 10 year risk for CVD, no indication for statin.  Osteoporosis Due for re-eval.    Eliezer Lofts, MD

## 2019-03-27 NOTE — Patient Instructions (Addendum)
We will call to set up mammogram and bone density.    We are committed to keeping you informed about the COVID-19 vaccine.  As the vaccine continues to become available for each phase, we will ensure that patients who meet the criteria receive the information they need to access vaccination opportunities. Continue to check your MyChart account and [email or web address] for updates. Please review the Phase 1b information below.  [facility] COVID-19 Vaccination Clinic - [place] On Tuesday, Jan. 19, the [facility] Division of Public Health [facility]) and [employer] [facility] began large-scale COVID-19 vaccinations at the [facility]. The vaccinations are appointment only and for those 65 and older.  Walk-ins will not be accepted.  All appointments are currently filled. Please join our waiting list for the next available appointments. We will contact you when appointments become available. Please do not sign up more than once.  Join Our Waiting List  Our daily vaccination capacity will continue to increase, including expansion of our [employer] site, increasing mobile clinics across our service region and plans to open COVID-19 vaccination clinics in [place] and [place] counties in February.  On Friday, Jan. 22, we announced the need to reschedule 10,400 vaccination appointments because of the state's decision not to allocate [facility] with COVID-19 vaccines for the week of Jan. 25. Our announcement of this news is available here.  Going forward, we will schedule vaccination appointments weekly based on vaccine allocations provided by the state.  On Friday, Jan. 29, the [facility] Department of Health and [employer] announced that [employer] and [facility] of [employer] will again be receiving vaccines. Our announcement of this news is available here.  We will first schedule vaccination appointments for those whose  appointments were cancelled last week, then schedule those on the [facility] waiting list in the order in which they registered. Those who had their appointment cancelled should expect notification by email (or by phone for those without email) at least 48 hours prior to their rescheduled appointment.  Other Vaccination Opportunities in Our Area We are also working in partnership with county health agencies in our service counties to ensure continuing vaccination availability in the weeks and months ahead. Learn more about each county's vaccination efforts in the website links below:   [place]  [facility]'s phase 1b vaccination guidelines, prioritizing those 65 and over as the next eligible group to receive the COVID-19 vaccine, are detailed at [email or web address]   Vaccine Safety and Effectiveness Clinical trials for the Pfizer COVID-19 vaccine involved 42,000 people and showed that the vaccine is more than 95% effective in preventing COVID-19 with no serious safety concerns. Similar results have been reported for the Moderna COVID-19 vaccine. Side effects reported in the Pfizer clinical trials include a sore arm at the injection site, fatigue, headache, chills and fever. While side effects from the Pfizer COVID-19 vaccine are higher than for a typical flu vaccine, they are lower in many ways than side effects from the leading vaccine to prevent shingles. Side effects are signs that a vaccine is working and are related to your immune system being stimulated to produce antibodies against infection. Side effects from vaccination are far less significant than health impacts from COVID-19.  Staying Informed Pharmacists, infectious disease doctors, critical care nurses and other experts at [facility] continue to speak publicly through media interviews and direct communication with our patients and communities about the safety,  effectiveness  and importance of vaccines to eliminate COVID-19. In addition, reliable information on vaccine safety, effectiveness, side effects and more is available on the following websites:  [place] Department of Health and Human Services COVID-19 Vaccine Information Website.  U.S. Centers for Disease Control and Prevention PIRJJ-88 Human resources officer.  Staying Safe We agree with the CDC on what we can do to help our communities get back to normal: Getting "back to normal" is going to take all of our tools. If we use all the tools we have, we stand the best chance of getting our families, communities, schools and workplaces "back to normal" sooner:  Get vaccinated as soon as vaccines become available within the phase of the state's vaccination rollout plan for which you meet the eligibility criteria.  Wear a mask.  Stay 6 feet from others and avoid crowds.  Wash hands often.  For our most current information, please visit [email or web address].

## 2019-03-27 NOTE — Assessment & Plan Note (Signed)
Good control on no med. 

## 2019-07-24 ENCOUNTER — Ambulatory Visit
Admission: RE | Admit: 2019-07-24 | Discharge: 2019-07-24 | Disposition: A | Discharge status: Home / Self Care | Payer: PPO | Source: Ambulatory Visit | Attending: Family Medicine | Admitting: Family Medicine

## 2019-07-24 DIAGNOSIS — Z1231 Encounter for screening mammogram for malignant neoplasm of breast: Secondary | ICD-10-CM | POA: Diagnosis not present

## 2019-07-24 DIAGNOSIS — M810 Age-related osteoporosis without current pathological fracture: Secondary | ICD-10-CM | POA: Insufficient documentation

## 2020-02-05 DIAGNOSIS — H9072 Mixed conductive and sensorineural hearing loss, unilateral, left ear, with unrestricted hearing on the contralateral side: Secondary | ICD-10-CM | POA: Diagnosis not present

## 2020-03-20 ENCOUNTER — Telehealth: Payer: Self-pay | Admitting: Family Medicine

## 2020-03-20 DIAGNOSIS — M810 Age-related osteoporosis without current pathological fracture: Secondary | ICD-10-CM

## 2020-03-20 DIAGNOSIS — E7800 Pure hypercholesterolemia, unspecified: Secondary | ICD-10-CM

## 2020-03-20 NOTE — Telephone Encounter (Signed)
-----   Message from Alvina Chou sent at 03/09/2020  3:14 PM EST ----- Regarding: Lab orders for Friday, 2.4.22 Patient is scheduled for CPX labs, please order future labs, Thanks , Camelia Eng

## 2020-03-26 ENCOUNTER — Other Ambulatory Visit: Payer: Self-pay

## 2020-03-26 ENCOUNTER — Other Ambulatory Visit (INDEPENDENT_AMBULATORY_CARE_PROVIDER_SITE_OTHER): Payer: Medicare HMO

## 2020-03-26 ENCOUNTER — Ambulatory Visit (INDEPENDENT_AMBULATORY_CARE_PROVIDER_SITE_OTHER): Payer: Medicare HMO

## 2020-03-26 ENCOUNTER — Other Ambulatory Visit: Payer: PPO

## 2020-03-26 DIAGNOSIS — E7800 Pure hypercholesterolemia, unspecified: Secondary | ICD-10-CM

## 2020-03-26 DIAGNOSIS — M810 Age-related osteoporosis without current pathological fracture: Secondary | ICD-10-CM | POA: Diagnosis not present

## 2020-03-26 DIAGNOSIS — Z0000 Encounter for general adult medical examination without abnormal findings: Secondary | ICD-10-CM

## 2020-03-26 LAB — COMPREHENSIVE METABOLIC PANEL
ALT: 17 U/L (ref 0–35)
AST: 20 U/L (ref 0–37)
Albumin: 4 g/dL (ref 3.5–5.2)
Alkaline Phosphatase: 69 U/L (ref 39–117)
BUN: 18 mg/dL (ref 6–23)
CO2: 29 mEq/L (ref 19–32)
Calcium: 9.3 mg/dL (ref 8.4–10.5)
Chloride: 106 mEq/L (ref 96–112)
Creatinine, Ser: 0.74 mg/dL (ref 0.40–1.20)
GFR: 83.76 mL/min (ref >60.00)
Glucose, Bld: 88 mg/dL (ref 70–99)
Potassium: 4.1 mEq/L (ref 3.5–5.1)
Sodium: 141 mEq/L (ref 135–145)
Total Bilirubin: 0.6 mg/dL (ref 0.2–1.2)
Total Protein: 6.5 g/dL (ref 6.0–8.3)

## 2020-03-26 LAB — LIPID PANEL
Cholesterol: 195 mg/dL (ref 0–200)
HDL: 74.6 mg/dL (ref >39.00)
LDL Cholesterol: 109 mg/dL — ABNORMAL HIGH (ref 0–99)
NonHDL: 120.46
Total CHOL/HDL Ratio: 3
Triglycerides: 58 mg/dL (ref 0.0–149.0)
VLDL: 11.6 mg/dL (ref 0.0–40.0)

## 2020-03-26 LAB — VITAMIN D 25 HYDROXY (VIT D DEFICIENCY, FRACTURES): VITD: 39.36 ng/mL (ref 30.00–100.00)

## 2020-03-26 NOTE — Patient Instructions (Signed)
Angela Chapman , Thank you for taking time to come for your Medicare Wellness Visit. I appreciate your ongoing commitment to your health goals. Please review the following plan we discussed and let me know if I can assist you in the future.   Screening recommendations/referrals: Colonoscopy: Up to date, completed 04/16/2013, due 03/2023 Mammogram: Up to date, completed 07/24/2019, due 07/2020 Bone Density: Up to date, completed 07/24/2019, due 07/2021 Recommended yearly ophthalmology/optometry visit for glaucoma screening and checkup Recommended yearly dental visit for hygiene and checkup  Vaccinations: Influenza vaccine: up to date, Please bring date to your physical so we can document in your chart  Pneumococcal vaccine: due, will get at physical  Tdap vaccine: decline-insurance Shingles vaccine: due, check with your insurance regarding coverage if interested    Covid-19:Completed series Please bring date to your physical so we can document in your chart   Advanced directives: Advance directive discussed with you today. Even though you declined this today please call our office should you change your mind and we can give you the proper paperwork for you to fill out.  Conditions/risks identified: hypercholesterolemia   Next appointment: Follow up in one year for your annual wellness visit    Preventive Care 65 Years and Older, Female Preventive care refers to lifestyle choices and visits with your health care provider that can promote health and wellness. What does preventive care include?  A yearly physical exam. This is also called an annual well check.  Dental exams once or twice a year.  Routine eye exams. Ask your health care provider how often you should have your eyes checked.  Personal lifestyle choices, including:  Daily care of your teeth and gums.  Regular physical activity.  Eating a healthy diet.  Avoiding tobacco and drug use.  Limiting alcohol use.  Practicing safe  sex.  Taking low-dose aspirin every day.  Taking vitamin and mineral supplements as recommended by your health care provider. What happens during an annual well check? The services and screenings done by your health care provider during your annual well check will depend on your age, overall health, lifestyle risk factors, and family history of disease. Counseling  Your health care provider may ask you questions about your:  Alcohol use.  Tobacco use.  Drug use.  Emotional well-being.  Home and relationship well-being.  Sexual activity.  Eating habits.  History of falls.  Memory and ability to understand (cognition).  Work and work Astronomer.  Reproductive health. Screening  You may have the following tests or measurements:  Height, weight, and BMI.  Blood pressure.  Lipid and cholesterol levels. These may be checked every 5 years, or more frequently if you are over 77 years old.  Skin check.  Lung cancer screening. You may have this screening every year starting at age 53 if you have a 30-pack-year history of smoking and currently smoke or have quit within the past 15 years.  Fecal occult blood test (FOBT) of the stool. You may have this test every year starting at age 54.  Flexible sigmoidoscopy or colonoscopy. You may have a sigmoidoscopy every 5 years or a colonoscopy every 10 years starting at age 88.  Hepatitis C blood test.  Hepatitis B blood test.  Sexually transmitted disease (STD) testing.  Diabetes screening. This is done by checking your blood sugar (glucose) after you have not eaten for a while (fasting). You may have this done every 1-3 years.  Bone density scan. This is done to screen for  osteoporosis. You may have this done starting at age 15.  Mammogram. This may be done every 1-2 years. Talk to your health care provider about how often you should have regular mammograms. Talk with your health care provider about your test results,  treatment options, and if necessary, the need for more tests. Vaccines  Your health care provider may recommend certain vaccines, such as:  Influenza vaccine. This is recommended every year.  Tetanus, diphtheria, and acellular pertussis (Tdap, Td) vaccine. You may need a Td booster every 10 years.  Zoster vaccine. You may need this after age 29.  Pneumococcal 13-valent conjugate (PCV13) vaccine. One dose is recommended after age 6.  Pneumococcal polysaccharide (PPSV23) vaccine. One dose is recommended after age 42. Talk to your health care provider about which screenings and vaccines you need and how often you need them. This information is not intended to replace advice given to you by your health care provider. Make sure you discuss any questions you have with your health care provider. Document Released: 03/05/2015 Document Revised: 10/27/2015 Document Reviewed: 12/08/2014 Elsevier Interactive Patient Education  2017 [place] Prevention in the Home Falls can cause injuries. They can happen to people of all ages. There are many things you can do to make your home safe and to help prevent falls. What can I do on the outside of my home?  Regularly fix the edges of walkways and driveways and fix any cracks.  Remove anything that might make you trip as you walk through a door, such as a raised step or threshold.  Trim any bushes or trees on the path to your home.  Use bright outdoor lighting.  Clear any walking paths of anything that might make someone trip, such as rocks or tools.  Regularly check to see if handrails are loose or broken. Make sure that both sides of any steps have handrails.  Any raised decks and porches should have guardrails on the edges.  Have any leaves, snow, or ice cleared regularly.  Use sand or salt on walking paths during winter.  Clean up any spills in your garage right away. This includes oil or grease spills. What can I do in the  bathroom?  Use night lights.  Install grab bars by the toilet and in the tub and shower. Do not use towel bars as grab bars.  Use non-skid mats or decals in the tub or shower.  If you need to sit down in the shower, use a plastic, non-slip stool.  Keep the floor dry. Clean up any water that spills on the floor as soon as it happens.  Remove soap buildup in the tub or shower regularly.  Attach bath mats securely with double-sided non-slip rug tape.  Do not have throw rugs and other things on the floor that can make you trip. What can I do in the bedroom?  Use night lights.  Make sure that you have a light by your bed that is easy to reach.  Do not use any sheets or blankets that are too big for your bed. They should not hang down onto the floor.  Have a firm chair that has side arms. You can use this for support while you get dressed.  Do not have throw rugs and other things on the floor that can make you trip. What can I do in the kitchen?  Clean up any spills right away.  Avoid walking on wet floors.  Keep items that you use  a lot in easy-to-reach places.  If you need to reach something above you, use a strong step stool that has a grab bar.  Keep electrical cords out of the way.  Do not use floor polish or wax that makes floors slippery. If you must use wax, use non-skid floor wax.  Do not have throw rugs and other things on the floor that can make you trip. What can I do with my stairs?  Do not leave any items on the stairs.  Make sure that there are handrails on both sides of the stairs and use them. Fix handrails that are broken or loose. Make sure that handrails are as long as the stairways.  Check any carpeting to make sure that it is firmly attached to the stairs. Fix any carpet that is loose or worn.  Avoid having throw rugs at the top or bottom of the stairs. If you do have throw rugs, attach them to the floor with carpet tape.  Make sure that you have a  light switch at the top of the stairs and the bottom of the stairs. If you do not have them, ask someone to add them for you. What else can I do to help prevent falls?  Wear shoes that:  Do not have high heels.  Have rubber bottoms.  Are comfortable and fit you well.  Are closed at the toe. Do not wear sandals.  If you use a stepladder:  Make sure that it is fully opened. Do not climb a closed stepladder.  Make sure that both sides of the stepladder are locked into place.  Ask someone to hold it for you, if possible.  Clearly mark and make sure that you can see:  Any grab bars or handrails.  First and last steps.  Where the edge of each step is.  Use tools that help you move around (mobility aids) if they are needed. These include:  Canes.  Walkers.  Scooters.  Crutches.  Turn on the lights when you go into a dark area. Replace any light bulbs as soon as they burn out.  Set up your furniture so you have a clear path. Avoid moving your furniture around.  If any of your floors are uneven, fix them.  If there are any pets around you, be aware of where they are.  Review your medicines with your doctor. Some medicines can make you feel dizzy. This can increase your chance of falling. Ask your doctor what other things that you can do to help prevent falls. This information is not intended to replace advice given to you by your health care provider. Make sure you discuss any questions you have with your health care provider. Document Released: 12/03/2008 Document Revised: 07/15/2015 Document Reviewed: 03/13/2014 Elsevier Interactive Patient Education  2017 [employer].

## 2020-03-26 NOTE — Progress Notes (Signed)
PCP notes:  Health Maintenance: Pneumococcal 23- due  Patient states she will bring documentation of flu and covid booster to physical so we can document  in her chart   Abnormal Screenings: none   Patient concerns: none   Nurse concerns: none   Next PCP appt: 03/30/2020 @ 10 am

## 2020-03-26 NOTE — Progress Notes (Signed)
Subjective:   Angela Chapman is a 70 y.o. female who presents for Medicare Annual (Subsequent) preventive examination.  Review of Systems: N/A      I connected with the patient today by telephone and verified that I am speaking with the correct person using two identifiers. Location patient: home Location nurse: work Persons participating in the telephone visit: patient, nurse.   I discussed the limitations, risks, security and privacy concerns of performing an evaluation and management service by telephone and the availability of in person appointments. I also discussed with the patient that there may be a patient responsible charge related to this service. The patient expressed understanding and verbally consented to this telephonic visit.        Cardiac Risk Factors include: advanced age (>22men, >8 women);Other (see comment), Risk factor comments: hypercholesterolemia     Objective:    Today's Vitals   There is no height or weight on file to calculate BMI.  Advanced Directives 03/26/2020  Does Patient Have a Medical Advance Directive? No  Would patient like information on creating a medical advance directive? No - Patient declined    Current Medications (verified) Outpatient Encounter Medications as of 03/26/2020  Medication Sig  . azelastine (ASTELIN) 0.1 % nasal spray Place 2 sprays into both nostrils 2 (two) times daily. Use in each nostril as directed  . Calcium Carbonate-Vitamin D 600-400 MG-UNIT tablet Take 1 tablet by mouth 2 (two) times daily.  . fluticasone (FLONASE) 50 MCG/ACT nasal spray PLACE 2 SPRAYS INTO BOTH NOSTRILS DAILY. AS NEEDED  . levocetirizine (XYZAL) 5 MG tablet Take 5 mg by mouth every evening.  . triamcinolone cream (KENALOG) 0.1 % Apply 1 application topically 2 (two) times daily as needed.   No facility-administered encounter medications on file as of 03/26/2020.    Allergies (verified) Oxycodone-acetaminophen and Sulfonamide derivatives    History: Past Medical History:  Diagnosis Date  . Allergy   . Blood transfusion without reported diagnosis   . Broken ribs    fell and broke several ribs 12-23-12  . Depression    no per pt  . Fibromyalgia   . GERD (gastroesophageal reflux disease)    Past Surgical History:  Procedure Laterality Date  . ABDOMINAL HYSTERECTOMY    . COLONOSCOPY    . WRIST SURGERY     left   Family History  Problem Relation Age of Onset  . Arthritis Mother   . Cancer Mother        breast  . Kidney disease Mother   . Diabetes Mother   . Breast cancer Mother 77  . Stroke Father   . Colon cancer Neg Hx   . Esophageal cancer Neg Hx   . Rectal cancer Neg Hx   . Stomach cancer Neg Hx    Social History   Socioeconomic History  . Marital status: Married    Spouse name: Not on file  . Number of children: Not on file  . Years of education: Not on file  . Highest education level: Not on file  Occupational History  . 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  Tobacco Use  . Smoking status: Never Smoker  . Smokeless tobacco: Never Used  Substance and Sexual Activity  . Alcohol use: No  . Drug use: No  . Sexual activity: Not on file  Other Topics Concern  . Not on file  Social History Narrative   Regular exercise yes 3 time weekly         Social  Determinants of Health   Financial Resource Strain: Low Risk   . Difficulty of Paying Living Expenses: Not hard at all  Food Insecurity: No Food Insecurity  . Worried About Programme researcher, broadcasting/film/video in the Last Year: Never true  . Ran Out of Food in the Last Year: Never true  Transportation Needs: No Transportation Needs  . Lack of Transportation (Medical): No  . Lack of Transportation (Non-Medical): No  Physical Activity: Insufficiently Active  . Days of Exercise per Week: 3 days  . Minutes of Exercise per Session: 30 min  Stress: No Stress Concern Present  . Feeling of Stress : Not at all  Social Connections: Not on file    Tobacco  Counseling Counseling given: Not Answered   Clinical Intake:  Pre-visit preparation completed: Yes  Pain : No/denies pain     Nutritional Risks: None Diabetes: No  How often do you need to have someone help you when you read instructions, pamphlets, or other written materials from your doctor or pharmacy?: 1 - Never What is the last grade level you completed in school?: college  Diabetic: No Nutrition Risk Assessment:  Has the patient had any N/V/D within the last 2 months?  No  Does the patient have any non-healing wounds?  No  Has the patient had any unintentional weight loss or weight gain?  No   Diabetes:  Is the patient diabetic?  No  If diabetic, was a CBG obtained today?  N/A Did the patient bring in their glucometer from home?  N/A How often do you monitor your CBG's? N/A.   Financial Strains and Diabetes Management:  Are you having any financial strains with the device, your supplies or your medication? N/A.  Does the patient want to be seen by Chronic Care Management for management of their diabetes?  N/A Would the patient like to be referred to a Nutritionist or for Diabetic Management?  N/A   Interpreter Needed?: No  Information entered by :: CJohnson, LPN   Activities of Daily Living In your present state of health, do you have any difficulty performing the following activities: 03/26/2020  Hearing? Y  Comment wears hearing aids  Vision? N  Difficulty concentrating or making decisions? N  Walking or climbing stairs? N  Dressing or bathing? N  Doing errands, shopping? N  Preparing Food and eating ? N  Using the Toilet? N  In the past six months, have you accidently leaked urine? N  Do you have problems with loss of bowel control? N  Managing your Medications? N  Managing your Finances? N  Housekeeping or managing your Housekeeping? N  Some recent data might be hidden    Patient Care Team: Excell Seltzer, MD as PCP - General  Indicate any  recent Medical Services you may have received from other than [facility] providers in the past year (date may be approximate).     Assessment:   This is a routine wellness examination for [place].  Hearing/Vision screen  Hearing Screening   125Hz  250Hz  500Hz  1000Hz  2000Hz  3000Hz  4000Hz  6000Hz  8000Hz   Right ear:           Left ear:           Vision Screening Comments: Patient gets annual eye exams   Dietary issues and exercise activities discussed: Current Exercise Habits: Home exercise routine, Type of exercise: walking, Time (Minutes): 30, Frequency (Times/Week): 3, Weekly Exercise (Minutes/Week): 90, Intensity: Moderate, Exercise limited by: None identified  Goals    .  Patient Stated     03/26/2020, I will continue to walk 2-3 times a day for about 30 minutes.       Depression Screen PHQ 2/9 Scores 03/26/2020 03/27/2019 03/01/2018  PHQ - 2 Score 0 0 0  PHQ- 9 Score 0 - -    Fall Risk Fall Risk  03/26/2020 03/27/2019 03/01/2018  Falls in the past year? 0 0 0  Number falls in past yr: 0 - -  Injury with Fall? 0 - -  Risk for fall due to : No Fall Risks - -  Follow up Falls evaluation completed;Falls prevention discussed - -    FALL RISK PREVENTION PERTAINING TO THE HOME:  Any stairs in or around the home? Yes  If so, are there any without handrails? No  Home free of loose throw rugs in walkways, pet beds, electrical cords, etc? Yes  Adequate lighting in your home to reduce risk of falls? Yes   ASSISTIVE DEVICES UTILIZED TO PREVENT FALLS:  Life alert? No  Use of a cane, walker or w/c? No  Grab bars in the bathroom? No  Shower chair or bench in shower? No  Elevated toilet seat or a handicapped toilet? No   TIMED UP AND GO:  Was the test performed? N/A telephone visit .    Cognitive Function: MMSE - Mini Mental State Exam 03/26/2020  Not completed: Refused       Mini Cog  Mini-Cog screen was completed. Maximum score is 22. A value of 0 denotes this part of the MMSE was not  completed or the patient failed this part of the Mini-Cog screening.  Immunizations Immunization History  Administered Date(s) Administered  . Influenza Split 12/14/2010, 11/07/2011  . Influenza Whole 11/20/2008, 11/17/2009  . Influenza, High Dose Seasonal PF 02/05/2018, 12/18/2018  . Influenza,inj,Quad PF,6+ Mos 12/13/2014, 11/15/2015  . Influenza-Unspecified 11/21/2018  . PFIZER(Purple Top)SARS-COV-2 Vaccination 06/02/2019, 06/23/2019  . Pneumococcal Conjugate-13 03/01/2018  . Td 05/11/2008    TDAP status: Due, Education has been provided regarding the importance of this vaccine. Advised may receive this vaccine at local pharmacy or Health Dept. Aware to provide a copy of the vaccination record if obtained from local pharmacy or Health Dept. Verbalized acceptance and understanding.  Flu Vaccine status: Up to date  Pneumococcal vaccine status: Due, Education has been provided regarding the importance of this vaccine. Advised may receive this vaccine at local pharmacy or Health Dept. Aware to provide a copy of the vaccination record if obtained from local pharmacy or Health Dept. Verbalized acceptance and understanding.  Covid-19 vaccine status: Completed vaccines  Qualifies for Shingles Vaccine? Yes   Zostavax completed No   Shingrix Completed?: No.    Education has been provided regarding the importance of this vaccine. Patient has been advised to call insurance company to determine out of pocket expense if they have not yet received this vaccine. Advised may also receive vaccine at local pharmacy or Health Dept. Verbalized acceptance and understanding.  Screening Tests Health Maintenance  Topic Date Due  . PNA vac Low Risk Adult (2 of 2 - PPSV23) 03/02/2019  . INFLUENZA VACCINE  09/21/2019  . COVID-19 Vaccine (3 - Booster for Pfizer series) 12/24/2019  . TETANUS/TDAP  03/26/2020 (Originally 05/12/2018)  . MAMMOGRAM  07/23/2020  . COLONOSCOPY (Pts 45-45yrs Insurance coverage will  need to be confirmed)  04/17/2023  . DEXA SCAN  Completed  . Hepatitis C Screening  Completed    Health Maintenance  Health Maintenance Due  Topic Date  Due  . PNA vac Low Risk Adult (2 of 2 - PPSV23) 03/02/2019  . INFLUENZA VACCINE  09/21/2019  . COVID-19 Vaccine (3 - Booster for Pfizer series) 12/24/2019    Colorectal cancer screening: Type of screening: Colonoscopy. Completed 04/16/2013. Repeat every 10 years  Mammogram status: Completed 07/24/2019. Repeat every year  Bone Density status: Completed 07/24/2019. Results reflect: Bone density results: OSTEOPOROSIS. Repeat every 2 years.  Lung Cancer Screening: (Low Dose CT Chest recommended if Age 17-80 years, 30 pack-year currently smoking OR have quit w/in 15years.) does not qualify.    Additional Screening:  Hepatitis C Screening: does qualify; Completed 01/25/2016  Vision Screening: Recommended annual ophthalmology exams for early detection of glaucoma and other disorders of the eye. Is the patient up to date with their annual eye exam?  Yes  Who is the provider or what is the name of the office in which the patient attends annual eye exams? Dr. Cephas Darby If pt is not established with a provider, would they like to be referred to a provider to establish care? No .   Dental Screening: Recommended annual dental exams for proper oral hygiene  Community Resource Referral / Chronic Care Management: CRR required this visit?  No   CCM required this visit?  No      Plan:     I have personally reviewed and noted the following in the patient's chart:   . Medical and social history . Use of alcohol, tobacco or illicit drugs  . Current medications and supplements . Functional ability and status . Nutritional status . Physical activity . Advanced directives . List of other physicians . Hospitalizations, surgeries, and ER visits in previous 12 months . Vitals . Screenings to include cognitive, depression, and  falls . Referrals and appointments  In addition, I have reviewed and discussed with patient certain preventive protocols, quality metrics, and best practice recommendations. A written personalized care plan for preventive services as well as general preventive health recommendations were provided to patient.   Due to this being a telephonic visit, the after visit summary with patients personalized plan was offered to patient via office or my-chart. Patient preferred to pick up at office at next visit or via mychart.   Angela Shy, LPN   [id number]

## 2020-03-26 NOTE — Progress Notes (Signed)
No critical labs need to be addressed urgently. We will discuss labs in detail at upcoming office visit.   

## 2020-03-29 ENCOUNTER — Ambulatory Visit: Payer: PPO

## 2020-03-30 ENCOUNTER — Other Ambulatory Visit: Payer: Self-pay

## 2020-03-30 ENCOUNTER — Encounter: Payer: Self-pay | Admitting: Family Medicine

## 2020-03-30 ENCOUNTER — Ambulatory Visit (INDEPENDENT_AMBULATORY_CARE_PROVIDER_SITE_OTHER): Payer: Medicare HMO | Admitting: Family Medicine

## 2020-03-30 VITALS — BP 150/82 | HR 76 | Temp 98.2°F | Ht 64.0 in | Wt 165.0 lb

## 2020-03-30 DIAGNOSIS — S41012A Laceration without foreign body of left shoulder, initial encounter: Secondary | ICD-10-CM | POA: Diagnosis not present

## 2020-03-30 DIAGNOSIS — E7800 Pure hypercholesterolemia, unspecified: Secondary | ICD-10-CM | POA: Diagnosis not present

## 2020-03-30 DIAGNOSIS — I83813 Varicose veins of bilateral lower extremities with pain: Secondary | ICD-10-CM | POA: Diagnosis not present

## 2020-03-30 DIAGNOSIS — B351 Tinea unguium: Secondary | ICD-10-CM

## 2020-03-30 DIAGNOSIS — F325 Major depressive disorder, single episode, in full remission: Secondary | ICD-10-CM | POA: Diagnosis not present

## 2020-03-30 DIAGNOSIS — R69 Illness, unspecified: Secondary | ICD-10-CM | POA: Diagnosis not present

## 2020-03-30 DIAGNOSIS — Z0000 Encounter for general adult medical examination without abnormal findings: Secondary | ICD-10-CM

## 2020-03-30 DIAGNOSIS — Z23 Encounter for immunization: Secondary | ICD-10-CM | POA: Diagnosis not present

## 2020-03-30 NOTE — Progress Notes (Signed)
Patient ID: Angela Chapman, female    DOB: 1950/03/03, 70 y.o.   MRN: [id number]  This visit was conducted in person.  BP (!) 150/82   Pulse 76   Temp 98.2 F (36.8 C) (Temporal)   Ht 5\' 4"  (1.626 m)   Wt 165 lb (74.8 kg)   SpO2 98%   BMI 28.32 kg/m    CC:  Chief Complaint  Patient presents with  . Annual Exam    Part 2    Subjective:   HPI: Angela Chapman is a 70 y.o. female presenting on 03/30/2020 for Annual Exam (Part 2)   She reports she cut her shoulder last week.. healing well.  Not sure what she cut it with.  She has discoloration of right great toe nail... feels it is fungal infection.  tried treaintg with tea tree oil... did not help.  She has varicose veins.   The patient saw a LPN or RN for medicare wellness visit.  Prevention and wellness was reviewed in detail. Note reviewed and important notes copied below.   Depression, major recurrent: in remission  Flowsheet Row Clinical Support from 03/26/2020 in [facility] Total Score 0     Elevated Cholesterol:   Lab Results  Component Value Date   CHOL 195 03/26/2020   HDL 74.60 03/26/2020   LDLCALC 109 (H) 03/26/2020   TRIG 58.0 03/26/2020   CHOLHDL 3 03/26/2020  Using medications without problems: Muscle aches:  Diet compliance: healthy Exercise: active Other complaints:  The 10-year ASCVD risk score 05/24/2020 DC Denman George., et al., 2013) is: 8.3%   Values used to calculate the score:     Age: 68 years     Sex: Female     Is Non-Hispanic African American: No     Diabetic: No     Tobacco smoker: No     Systolic Blood Pressure: 150 mmHg     Is BP treated: No     HDL Cholesterol: 74.6 mg/dL     Total Cholesterol: 195 mg/dL       Relevant past medical, surgical, family and social history reviewed and updated as indicated. Interim medical history since our last visit reviewed. Allergies and medications reviewed and updated. Outpatient Medications Prior to Visit  Medication  Sig Dispense Refill  . Calcium Carbonate-Vitamin D 600-400 MG-UNIT tablet Take 1 tablet by mouth 2 (two) times daily.    79 levocetirizine (XYZAL) 5 MG tablet Take 5 mg by mouth every evening.    . triamcinolone cream (KENALOG) 0.1 % Apply 1 application topically 2 (two) times daily as needed.    Marland Kitchen azelastine (ASTELIN) 0.1 % nasal spray Place 2 sprays into both nostrils 2 (two) times daily. Use in each nostril as directed (Patient not taking: Reported on 03/30/2020) 30 mL 12  . fluticasone (FLONASE) 50 MCG/ACT nasal spray PLACE 2 SPRAYS INTO BOTH NOSTRILS DAILY. AS NEEDED (Patient not taking: Reported on 03/30/2020) 16 mL 2   No facility-administered medications prior to visit.     Per HPI unless specifically indicated in ROS section below Review of Systems  Constitutional: Negative for fatigue and fever.  HENT: Negative for congestion.   Eyes: Negative for pain.  Respiratory: Negative for cough and shortness of breath.   Cardiovascular: Negative for chest pain, palpitations and leg swelling.  Gastrointestinal: Negative for abdominal pain.  Genitourinary: Negative for dysuria and vaginal bleeding.  Musculoskeletal: Negative for back pain.  Neurological: Negative for syncope, light-headedness and headaches.  Psychiatric/Behavioral: Negative for dysphoric mood.   Objective:  BP (!) 150/82   Pulse 76   Temp 98.2 F (36.8 C) (Temporal)   Ht 5\' 4"  (1.626 m)   Wt 165 lb (74.8 kg)   SpO2 98%   BMI 28.32 kg/m   Wt Readings from Last 3 Encounters:  03/30/20 165 lb (74.8 kg)  03/27/19 173 lb 12.8 oz (78.8 kg)  03/01/18 180 lb 12 oz (82 kg)      Physical Exam Constitutional:      General: She is not in acute distress.    Appearance: Normal appearance. She is well-developed. She is not ill-appearing or toxic-appearing.  HENT:     Head: Normocephalic.     Right Ear: Hearing, tympanic membrane, ear canal and external ear normal.     Left Ear: Hearing, tympanic membrane, ear canal and  external ear normal.     Nose: Nose normal.  Eyes:     General: Lids are normal. Lids are everted, no foreign bodies appreciated.     Conjunctiva/sclera: Conjunctivae normal.     Pupils: Pupils are equal, round, and reactive to light.  Neck:     Thyroid: No thyroid mass or thyromegaly.     Vascular: No carotid bruit.     Trachea: Trachea normal.  Cardiovascular:     Rate and Rhythm: Normal rate and regular rhythm.     Heart sounds: Normal heart sounds, S1 normal and S2 normal. No murmur heard. No gallop.   Pulmonary:     Effort: Pulmonary effort is normal. No respiratory distress.     Breath sounds: Normal breath sounds. No wheezing, rhonchi or rales.  Abdominal:     General: Bowel sounds are normal. There is no distension or abdominal bruit.     Palpations: Abdomen is soft. There is no fluid wave or mass.     Tenderness: There is no abdominal tenderness. There is no guarding or rebound.     Hernia: No hernia is present.  Musculoskeletal:     Cervical back: Normal range of motion and neck supple.  Feet:     Comments: Thickened nails, fungal infection Lymphadenopathy:     Cervical: No cervical adenopathy.  Skin:    General: Skin is warm and dry.     Findings: No rash.  Neurological:     Mental Status: She is alert.     Cranial Nerves: No cranial nerve deficit.     Sensory: No sensory deficit.  Psychiatric:        Mood and Affect: Mood is not anxious or depressed.        Speech: Speech normal.        Behavior: Behavior normal. Behavior is cooperative.        Judgment: Judgment normal.       Results for orders placed or performed in visit on 03/26/20  VITAMIN D 25 Hydroxy (Vit-D Deficiency, Fractures)  Result Value Ref Range   VITD 39.36 30.00 - 100.00 ng/mL  Comprehensive metabolic panel  Result Value Ref Range   Sodium 141 135 - 145 mEq/L   Potassium 4.1 3.5 - 5.1 mEq/L   Chloride 106 96 - 112 mEq/L   CO2 29 19 - 32 mEq/L   Glucose, Bld 88 70 - 99 mg/dL   BUN 18 6  - 23 mg/dL   Creatinine, Ser 05/24/20 0.40 - 1.20 mg/dL   Total Bilirubin 0.6 0.2 - 1.2 mg/dL   Alkaline Phosphatase 69 39 - 117 U/L  AST 20 0 - 37 U/L   ALT 17 0 - 35 U/L   Total Protein 6.5 6.0 - 8.3 g/dL   Albumin 4.0 3.5 - 5.2 g/dL   GFR 93.81 >01.75 mL/min   Calcium 9.3 8.4 - 10.5 mg/dL  Lipid panel  Result Value Ref Range   Cholesterol 195 0 - 200 mg/dL   Triglycerides 10.2 0.0 - 149.0 mg/dL   HDL 58.52 >77.82 mg/dL   VLDL 42.3 0.0 - 53.6 mg/dL   LDL Cholesterol 144 (H) 0 - 99 mg/dL   Total CHOL/HDL Ratio 3    NonHDL 120.46     This visit occurred during the SARS-CoV-2 public health emergency.  Safety protocols were in place, including screening questions prior to the visit, additional usage of staff PPE, and extensive cleaning of exam room while observing appropriate contact time as indicated for disinfecting solutions.   COVID 19 screen:  No recent travel or known exposure to COVID19 The patient denies respiratory symptoms of COVID 19 at this time. The importance of social distancing was discussed today.   Assessment and Plan   The patient's preventative maintenance and recommended screening tests for an annual wellness exam were reviewed in full today. Brought up to date unless services declined.  Counselled on the importance of diet, exercise, and its role in overall health and mortality. The patient's FH and SH was reviewed, including their home life, tobacco status, and drug and alcohol status.   Vaccines:uptodate except zostavax,and due for pneumovax today. Encouraged COVID vaccine. Pap/DVE:Not indicated TAH Mammo:07/2019, repeat q2 years Bone Density:osteoporosis  In hip 07/2019. She is not interested in  Treatment. Colon:2015DrChristella Hartigan, nml, repeat in 10 years.. Smoking Status:none HIV screen:refused. Hep C: negative  Problem List Items Addressed This Visit    Depression, major, in remission (HCC)    Doing well on no medication.      Laceration  of left shoulder    Healing.      Relevant Orders   Td : Tetanus/diphtheria >7yo Preservative  free (Completed)   PURE HYPERCHOLESTEROLEMIA    Work on low cholesterol diet.  Can return for cholesterol recheck in 3 months if interested.       Toenail fungus    Can use topical penlac to treat.       Other Visit Diagnoses    Routine general medical examination at a health care facility    -  Primary   Varicose veins of both lower extremities with pain       Need for 23-polyvalent pneumococcal polysaccharide vaccine       Relevant Orders   Pneumococcal polysaccharide vaccine 23-valent greater than or equal to 2yo subcutaneous/IM (Completed)   Need for Td vaccine       Relevant Orders   Td : Tetanus/diphtheria >7yo Preservative  free (Completed)       Kerby Nora, MD

## 2020-03-30 NOTE — Patient Instructions (Addendum)
Start wearing compression hose.  Start daily penlac fungal Laquer.   Work on low cholesterol diet.  Can return for cholesterol recheck in 3 months if interested.

## 2020-05-18 DIAGNOSIS — H2513 Age-related nuclear cataract, bilateral: Secondary | ICD-10-CM | POA: Diagnosis not present

## 2020-05-19 DIAGNOSIS — B351 Tinea unguium: Secondary | ICD-10-CM | POA: Insufficient documentation

## 2020-05-19 DIAGNOSIS — S41012A Laceration without foreign body of left shoulder, initial encounter: Secondary | ICD-10-CM | POA: Insufficient documentation

## 2020-05-19 NOTE — Assessment & Plan Note (Signed)
Healing

## 2020-05-19 NOTE — Assessment & Plan Note (Signed)
Work on low cholesterol diet.  Can return for cholesterol recheck in 3 months if interested.

## 2020-05-19 NOTE — Assessment & Plan Note (Signed)
Doing well on no medication. 

## 2020-05-19 NOTE — Assessment & Plan Note (Signed)
Can use topical penlac to treat.

## 2020-10-19 ENCOUNTER — Ambulatory Visit
Admission: RE | Admit: 2020-10-19 | Discharge: 2020-10-19 | Disposition: A | Discharge status: Home / Self Care | Payer: Medicare HMO | Source: Ambulatory Visit | Attending: Family Medicine | Admitting: Family Medicine

## 2020-10-19 ENCOUNTER — Ambulatory Visit (INDEPENDENT_AMBULATORY_CARE_PROVIDER_SITE_OTHER): Payer: Medicare HMO | Admitting: Family Medicine

## 2020-10-19 ENCOUNTER — Other Ambulatory Visit: Payer: Self-pay | Admitting: Family Medicine

## 2020-10-19 ENCOUNTER — Other Ambulatory Visit: Payer: Self-pay

## 2020-10-19 ENCOUNTER — Encounter: Payer: Self-pay | Admitting: Family Medicine

## 2020-10-19 VITALS — BP 140/70 | HR 75 | Temp 98.1°F | Ht 64.0 in | Wt 165.2 lb

## 2020-10-19 DIAGNOSIS — M79604 Pain in right leg: Secondary | ICD-10-CM

## 2020-10-19 DIAGNOSIS — M79661 Pain in right lower leg: Secondary | ICD-10-CM | POA: Diagnosis not present

## 2020-10-19 MED ORDER — RIVAROXABAN 10 MG PO TABS: 10.0000 mg | ORAL_TABLET | Freq: Every day | ORAL | 0 refills | Status: DC

## 2020-10-19 MED ORDER — V-2 HIGH COMPRESSION HOSE MISC: 0 refills | Status: AC

## 2020-10-19 NOTE — Progress Notes (Signed)
Patient ID: Angela Chapman, female    DOB: 1950/11/21, 70 y.o.   MRN: [id number]  This visit was conducted in person.  BP 140/70   Pulse 75   Temp 98.1 F (36.7 C) (Temporal)   Ht 5\' 4"  (1.626 m)   Wt 165 lb 4 oz (75 kg)   SpO2 99%   BMI 28.37 kg/m    CC:  Chief Complaint  Patient presents with   Insect Bite    Right Lower Leg    Subjective:   HPI: Angela Chapman is a 70 y.o. female  with varicose veins presenting on 10/19/2020 for Insect Bite (Right Lower Leg)  She reports  last week she went camping.. had multiple insect bites ( possibly noseeums) One bite on posterior thigh on right... seemed to improve  Yesterday noted 2 bumps in right calf.. has noted swelling in right lower leg.  Heat and redness and pain over bumps in leg.   No N/V, no fever, no flu like symptoms.   Applying to ice to lumps, essential oils. No SOb, no CP.  She has long trip coming up tommorow night.     Relevant past medical, surgical, family and social history reviewed and updated as indicated. Interim medical history since our last visit reviewed. Allergies and medications reviewed and updated. Outpatient Medications Prior to Visit  Medication Sig Dispense Refill   sodium chloride (AYR) 0.65 % nasal spray Place 1 spray into the nose as needed for congestion.     azelastine (ASTELIN) 0.1 % nasal spray Place 2 sprays into both nostrils 2 (two) times daily. Use in each nostril as directed (Patient not taking: No sig reported) 30 mL 12   Calcium Carbonate-Vitamin D 600-400 MG-UNIT tablet Take 1 tablet by mouth 2 (two) times daily.     levocetirizine (XYZAL) 5 MG tablet Take 5 mg by mouth every evening.     triamcinolone cream (KENALOG) 0.1 % Apply 1 application topically 2 (two) times daily as needed.     fluticasone (FLONASE) 50 MCG/ACT nasal spray PLACE 2 SPRAYS INTO BOTH NOSTRILS DAILY. AS NEEDED (Patient not taking: Reported on 03/30/2020) 16 mL 2   No facility-administered medications prior to  visit.     Per HPI unless specifically indicated in ROS section below Review of Systems  Constitutional:  Negative for fatigue and fever.  HENT:  Negative for congestion.   Eyes:  Negative for pain.  Respiratory:  Negative for cough and shortness of breath.   Cardiovascular:  Negative for chest pain, palpitations and leg swelling.  Gastrointestinal:  Negative for abdominal pain.  Genitourinary:  Negative for dysuria and vaginal bleeding.  Musculoskeletal:  Negative for back pain.  Neurological:  Negative for syncope, light-headedness and headaches.  Psychiatric/Behavioral:  Negative for dysphoric mood.   Objective:  BP 140/70   Pulse 75   Temp 98.1 F (36.7 C) (Temporal)   Ht 5\' 4"  (1.626 m)   Wt 165 lb 4 oz (75 kg)   SpO2 99%   BMI 28.37 kg/m   Wt Readings from Last 3 Encounters:  10/19/20 165 lb 4 oz (75 kg)  03/30/20 165 lb (74.8 kg)  03/27/19 173 lb 12.8 oz (78.8 kg)      Physical Exam Constitutional:      General: She is not in acute distress.    Appearance: Normal appearance. She is well-developed. She is not ill-appearing or toxic-appearing.  HENT:     Head: Normocephalic.     Right Ear:  Hearing, tympanic membrane, ear canal and external ear normal. Tympanic membrane is not erythematous, retracted or bulging.     Left Ear: Hearing, tympanic membrane, ear canal and external ear normal. Tympanic membrane is not erythematous, retracted or bulging.     Nose: No mucosal edema or rhinorrhea.     Right Sinus: No maxillary sinus tenderness or frontal sinus tenderness.     Left Sinus: No maxillary sinus tenderness or frontal sinus tenderness.     Mouth/Throat:     Pharynx: Uvula midline.  Eyes:     General: Lids are normal. Lids are everted, no foreign bodies appreciated.     Conjunctiva/sclera: Conjunctivae normal.     Pupils: Pupils are equal, round, and reactive to light.  Neck:     Thyroid: No thyroid mass or thyromegaly.     Vascular: No carotid bruit.      Trachea: Trachea normal.  Cardiovascular:     Rate and Rhythm: Normal rate and regular rhythm.     Pulses: Normal pulses.     Heart sounds: Normal heart sounds, S1 normal and S2 normal. No murmur heard.   No friction rub. No gallop.  Pulmonary:     Effort: Pulmonary effort is normal. No tachypnea or respiratory distress.     Breath sounds: Normal breath sounds. No decreased breath sounds, wheezing, rhonchi or rales.  Abdominal:     General: Bowel sounds are normal.     Palpations: Abdomen is soft.     Tenderness: There is no abdominal tenderness.  Musculoskeletal:     Cervical back: Normal range of motion and neck supple.  Skin:    General: Skin is warm and dry.     Findings: Lesion present. No rash.     Comments: Tender varicosity with surrounding erythema in right upper calf.. concerning for phlebitis [place]  Pain with compression of calf on right   Neurological:     Mental Status: She is alert.  Psychiatric:        Mood and Affect: Mood is not anxious or depressed.        Speech: Speech normal.        Behavior: Behavior normal. Behavior is cooperative.        Thought Content: Thought content normal.        Judgment: Judgment normal.      Results for orders placed or performed in visit on 03/26/20  VITAMIN D 25 Hydroxy (Vit-D Deficiency, Fractures)  Result Value Ref Range   VITD 39.36 30.00 - 100.00 ng/mL  Comprehensive metabolic panel  Result Value Ref Range   Sodium 141 135 - 145 mEq/L   Potassium 4.1 3.5 - 5.1 mEq/L   Chloride 106 96 - 112 mEq/L   CO2 29 19 - 32 mEq/L   Glucose, Bld 88 70 - 99 mg/dL   BUN 18 6 - 23 mg/dL   Creatinine, Ser 3.29 0.40 - 1.20 mg/dL   Total Bilirubin 0.6 0.2 - 1.2 mg/dL   Alkaline Phosphatase 69 39 - 117 U/L   AST 20 0 - 37 U/L   ALT 17 0 - 35 U/L   Total Protein 6.5 6.0 - 8.3 g/dL   Albumin 4.0 3.5 - 5.2 g/dL   GFR 92.42 >68.34 mL/min   Calcium 9.3 8.4 - 10.5 mg/dL  Lipid panel  Result Value Ref Range   Cholesterol 195 0 - 200  mg/dL   Triglycerides 19.6 0.0 - 149.0 mg/dL   HDL 22.29 >79.89 mg/dL   VLDL  11.6 0.0 - 40.0 mg/dL   LDL Cholesterol 774 (H) 0 - 99 mg/dL   Total CHOL/HDL Ratio 3    NonHDL 120.46     This visit occurred during the SARS-CoV-2 public health emergency.  Safety protocols were in place, including screening questions prior to the visit, additional usage of staff PPE, and extensive cleaning of exam room while observing appropriate contact time as indicated for disinfecting solutions.   COVID 19 screen:  No recent travel or known exposure to COVID19 The patient denies respiratory symptoms of COVID 19 at this time. The importance of social distancing was discussed today.   Assessment and Plan    Problem List Items Addressed This Visit     Right leg pain - Primary    Elevate right leg.  We will set up a referral for [place] of right leg.  Apply topical steroid cream to area on leg, heat on leg.       Relevant Orders   US Venous Img Lower Unilateral Right (DVT) (Completed)     Kerby Nora, MD

## 2020-10-19 NOTE — Patient Instructions (Addendum)
Elevate right leg.  We will set up a referral for US of right leg.  Apply topical steroid cream to area on leg, heat on leg.  

## 2020-10-21 ENCOUNTER — Telehealth: Payer: Self-pay

## 2020-10-21 NOTE — Telephone Encounter (Signed)
Vm from pt requesting call back from [place].  Pt is asking what exercise is best for her for the blood clot.  Plz advise.

## 2020-10-21 NOTE — Telephone Encounter (Signed)
Angela Chapman notified as instructed by telephone.  She ask when she should start wearing support hose.  Per Dr. Ermalene Searing, she should be on the blood thinner at least 5 days before trying to wear support hose.  Patient advised that when she starts wearing support hose, if it is painful, then she needs to stop wearing them.  Patient also wanted to know if it is okay for her to continue taking her multivitamin along with the blood thinner.  Patient advised it is okay to continue taking her multivitiamin.  We also schedule patient for her 1 month follow up on 11/19/2020 at 11:00 am.  [place] address provided.

## 2020-10-26 ENCOUNTER — Telehealth: Payer: Self-pay | Admitting: Family Medicine

## 2020-10-26 NOTE — Telephone Encounter (Signed)
Please call and get more info.. is it painful still, more or less redness.?

## 2020-10-26 NOTE — Telephone Encounter (Signed)
Pt called stating her hard spot is still hard curious to know if she should take futher percaution been on blood thinners for 8 days

## 2020-10-27 NOTE — Telephone Encounter (Signed)
I spoke with pt;pt said her pain level is much less now. Size of knot has decreased; now size of marble 1" in diameter. Redness has faded but now is blue colored. The knot is located below the rt knee. Pt said after [place] was told not to wear compress hose. Pt wants to know does the knot have to be completely gone before starting to wear compress hose and what length of hose should pt get; thigh high or  knee high. Pt also wonders if Dr Ermalene Searing has idea when knot might go away. Pt does not have any new knots but pt is on and off having a sharpe pain in calf of leg and in ankle. Pt wants to know if this is to be expected. Pt said she has been walking 20 -45' which is little less than a mile; pt also was in pool laying on back and moving legs with leg lifts and kicking legs. Pt said after got out of pool legs felt so much better. The knot is still hard feeling to touch but not as swollen. Pt has been taking xarelto as instructed.  Pt has not been taking ASAIDS and pt said she has already talked to Dr Ermalene Searing about this and pt is using topically essential oil blend and that helps pts pain.pt is elevating legs when sitting and elevates leg on pillow when laying. Pt said she has FU appt scheduled with Dr Ermalene Searing on 11/19/20. [facility] if needed.Sending note to DR Ermalene Searing and Lupita Leash CMA.

## 2020-10-27 NOTE — Telephone Encounter (Signed)
Rena,  Please call and triage for Dr. Ermalene Searing.

## 2020-10-28 NOTE — Telephone Encounter (Signed)
Angela Chapman notified as instructed by telephone.  Patient states understanding.

## 2020-10-28 NOTE — Telephone Encounter (Signed)
Given redness gone.. she can wear thigh high compression hose 15-30 mm HG if not to uncomfortable.  Clot is superficial ... continue exercise.  May take months for nodule and pain to resolve completely.  Document no chest pain and no SOB.  Will re ultrasound 9/30 unless pain starts increasing ( then would do sooner).  Continue Xarelto.

## 2020-11-17 DIAGNOSIS — M79604 Pain in right leg: Secondary | ICD-10-CM | POA: Insufficient documentation

## 2020-11-17 NOTE — Assessment & Plan Note (Signed)
Elevate right leg.  We will set up a referral for [place] of right leg.  Apply topical steroid cream to area on leg, heat on leg.

## 2020-11-19 ENCOUNTER — Ambulatory Visit (INDEPENDENT_AMBULATORY_CARE_PROVIDER_SITE_OTHER): Payer: Medicare HMO | Admitting: Family Medicine

## 2020-11-19 ENCOUNTER — Encounter: Payer: Self-pay | Admitting: Family Medicine

## 2020-11-19 ENCOUNTER — Other Ambulatory Visit: Payer: Self-pay

## 2020-11-19 VITALS — BP 112/72 | HR 79 | Temp 97.7°F | Ht 64.0 in | Wt 165.0 lb

## 2020-11-19 DIAGNOSIS — I8001 Phlebitis and thrombophlebitis of superficial vessels of right lower extremity: Secondary | ICD-10-CM | POA: Diagnosis not present

## 2020-11-19 DIAGNOSIS — I83813 Varicose veins of bilateral lower extremities with pain: Secondary | ICD-10-CM | POA: Diagnosis not present

## 2020-11-19 NOTE — Progress Notes (Signed)
Patient ID: Angela Chapman, female    DOB: [birth year], 70 y.o.   MRN: [id number]  This visit was conducted in person.  BP 112/72   Pulse 79   Temp 97.7 F (36.5 C) (Temporal)   Ht 5\' 4"  (1.626 m)   Wt 165 lb (74.8 kg)   SpO2 98%   BMI 28.32 kg/m    CC: Chief Complaint  Patient presents with   blood clot follow up     Subjective:   HPI: Angela Chapman is a 70 y.o. female presenting on 11/19/2020 for blood clot follow up   1 month S/P start of Xarelto for superficial thrombophlebitis in greater saphenous vein above knee.  Xarelto 10 mg daily x 45 days given greater saphenous vein above knee and increased risk of extention of clot to DSVT (per uptodate)  Okay to wear compression hose.  Avoid extended car trip / immobility.   10/19/2020 Doppler Lower ext right:  IMPRESSION: 1. No evidence of DVT within right lower extremity. 2. Examination is positive for age-indeterminate occlusive superficial thrombophlebitis involving the greater saphenous vein at the level of the knee and calf. While potentially chronic in etiology, in the absence of prior examinations, an acute on chronic process is not excluded. Clinical correlation is advised. Again, there is no extension of this occlusive SVT to the deep venous system of the right lower extremity.     Today she reports  pain is improved. Only occ pain in right calf.  No redness at site in med ail upper calf.  She still has firm knot on right leg.  She has been wearing compression hose, elevating legs.   No SE to Xarelto.. no bleeding. No swelling in right leg versus the left   No CP, no SOB.   She has varicose veins in bilateral legs.      Relevant past medical, surgical, family and social history reviewed and updated as indicated. Interim medical history since our last visit reviewed. Allergies and medications reviewed and updated. Outpatient Medications Prior to Visit  Medication Sig Dispense Refill   Calcium  Carbonate-Vitamin D 600-400 MG-UNIT tablet Take 1 tablet by mouth 2 (two) times daily.     Elastic Bandages & Supports (V-2 HIGH COMPRESSION HOSE) MISC 15-30 mm HG compression, 1 pair wear daily, knee high 1 each 0   levocetirizine (XYZAL) 5 MG tablet Take 5 mg by mouth every evening.     rivaroxaban (XARELTO) 10 MG TABS tablet Take 1 tablet (10 mg total) by mouth daily. 45 tablet 0   sodium chloride (OCEAN) 0.65 % nasal spray Place 1 spray into the nose as needed for congestion.     triamcinolone cream (KENALOG) 0.1 % Apply 1 application topically 2 (two) times daily as needed.     azelastine (ASTELIN) 0.1 % nasal spray Place 2 sprays into both nostrils 2 (two) times daily. Use in each nostril as directed (Patient not taking: No sig reported) 30 mL 12   No facility-administered medications prior to visit.     Per HPI unless specifically indicated in ROS section below Review of Systems  Constitutional:  Negative for fatigue and fever.  HENT:  Negative for congestion.   Eyes:  Negative for pain.  Respiratory:  Negative for cough and shortness of breath.   Cardiovascular:  Negative for chest pain, palpitations and leg swelling.  Gastrointestinal:  Negative for abdominal pain.  Genitourinary:  Negative for dysuria and vaginal bleeding.  Musculoskeletal:  Negative for back pain.  Neurological:  Negative for syncope, light-headedness and headaches.  Psychiatric/Behavioral:  Negative for dysphoric mood.   Objective:  BP 112/72   Pulse 79   Temp 97.7 F (36.5 C) (Temporal)   Ht 5\' 4"  (1.626 m)   Wt 165 lb (74.8 kg)   SpO2 98%   BMI 28.32 kg/m   Wt Readings from Last 3 Encounters:  11/19/20 165 lb (74.8 kg)  10/19/20 165 lb 4 oz (75 kg)  03/30/20 165 lb (74.8 kg)      Physical Exam Constitutional:      General: She is not in acute distress.    Appearance: Normal appearance. She is well-developed. She is not ill-appearing or toxic-appearing.  HENT:     Head: Normocephalic.      Right Ear: Hearing, tympanic membrane, ear canal and external ear normal. Tympanic membrane is not erythematous, retracted or bulging.     Left Ear: Hearing, tympanic membrane, ear canal and external ear normal. Tympanic membrane is not erythematous, retracted or bulging.     Nose: No mucosal edema or rhinorrhea.     Right Sinus: No maxillary sinus tenderness or frontal sinus tenderness.     Left Sinus: No maxillary sinus tenderness or frontal sinus tenderness.     Mouth/Throat:     Pharynx: Uvula midline.  Eyes:     General: Lids are normal. Lids are everted, no foreign bodies appreciated.     Conjunctiva/sclera: Conjunctivae normal.     Pupils: Pupils are equal, round, and reactive to light.  Neck:     Thyroid: No thyroid mass or thyromegaly.     Vascular: No carotid bruit.     Trachea: Trachea normal.  Cardiovascular:     Rate and Rhythm: Normal rate and regular rhythm.     Pulses: Normal pulses.     Heart sounds: Normal heart sounds, S1 normal and S2 normal. No murmur heard.   No friction rub. No gallop.     Comments: Firm cord on right upper calf medially, interval decrease in size, pain and no longer associated redness at site  Bilateral varicosities Pulmonary:     Effort: Pulmonary effort is normal. No tachypnea or respiratory distress.     Breath sounds: Normal breath sounds. No decreased breath sounds, wheezing, rhonchi or rales.  Abdominal:     General: Bowel sounds are normal.     Palpations: Abdomen is soft.     Tenderness: There is no abdominal tenderness.  Musculoskeletal:     Cervical back: Normal range of motion and neck supple.     Right lower leg: No edema.     Left lower leg: No edema.  Skin:    General: Skin is warm and dry.     Findings: No rash.  Neurological:     Mental Status: She is alert.  Psychiatric:        Mood and Affect: Mood is not anxious or depressed.        Speech: Speech normal.        Behavior: Behavior normal. Behavior is cooperative.         Thought Content: Thought content normal.        Judgment: Judgment normal.      Results for orders placed or performed in visit on 03/26/20  VITAMIN D 25 Hydroxy (Vit-D Deficiency, Fractures)  Result Value Ref Range   VITD 39.36 30.00 - 100.00 ng/mL  Comprehensive metabolic panel  Result Value Ref Range   Sodium 141 135 - 145 mEq/L  Potassium 4.1 3.5 - 5.1 mEq/L   Chloride 106 96 - 112 mEq/L   CO2 29 19 - 32 mEq/L   Glucose, Bld 88 70 - 99 mg/dL   BUN 18 6 - 23 mg/dL   Creatinine, Ser 1.32 0.40 - 1.20 mg/dL   Total Bilirubin 0.6 0.2 - 1.2 mg/dL   Alkaline Phosphatase 69 39 - 117 U/L   AST 20 0 - 37 U/L   ALT 17 0 - 35 U/L   Total Protein 6.5 6.0 - 8.3 g/dL   Albumin 4.0 3.5 - 5.2 g/dL   GFR 44.01 >02.72 mL/min   Calcium 9.3 8.4 - 10.5 mg/dL  Lipid panel  Result Value Ref Range   Cholesterol 195 0 - 200 mg/dL   Triglycerides 53.6 0.0 - 149.0 mg/dL   HDL 64.40 >34.74 mg/dL   VLDL 25.9 0.0 - 56.3 mg/dL   LDL Cholesterol 875 (H) 0 - 99 mg/dL   Total CHOL/HDL Ratio 3    NonHDL 120.46     This visit occurred during the SARS-CoV-2 public health emergency.  Safety protocols were in place, including screening questions prior to the visit, additional usage of staff PPE, and extensive cleaning of exam room while observing appropriate contact time as indicated for disinfecting solutions.   COVID 19 screen:  No recent travel or known exposure to COVID19 The patient denies respiratory symptoms of COVID 19 at this time. The importance of social distancing was discussed today.   Assessment and Plan    Problem List Items Addressed This Visit     Thrombophlebitis of saphenous vein, right - Primary     ON day 30/45 of treatment plan for 45 days of Xarelto given superficial phlebitis in greater saphenous vein, extending above knee. Elevating legs, wearing compression hose.  She has had improvement in pain and redness but not at baseline.  Recommended repeat US to evaluate for  clot [progression or resolution but given patient chronic bialteral leg pain fom varicosities she would like to proceed to vascular consult for this as well as further recommendations on  Continued treatment of the current phlebitis.       Relevant Orders   Ambulatory referral to Vascular Surgery   Varicose veins of bilateral lower extremities with pain    Chronic, has ache in bilateral legs.  Using compression hose.      Relevant Orders   Ambulatory referral to Vascular Surgery     Kerby Nora, MD

## 2020-11-19 NOTE — Patient Instructions (Signed)
We will move ahead will referral to Vascular  Doctor.  Continue Xarelto at current dosing.

## 2020-12-06 ENCOUNTER — Telehealth: Payer: Self-pay | Admitting: Family Medicine

## 2020-12-06 DIAGNOSIS — I8001 Phlebitis and thrombophlebitis of superficial vessels of right lower extremity: Secondary | ICD-10-CM | POA: Diagnosis not present

## 2020-12-06 DIAGNOSIS — I8312 Varicose veins of left lower extremity with inflammation: Secondary | ICD-10-CM | POA: Diagnosis not present

## 2020-12-06 DIAGNOSIS — I8311 Varicose veins of right lower extremity with inflammation: Secondary | ICD-10-CM | POA: Diagnosis not present

## 2020-12-06 NOTE — Telephone Encounter (Deleted)
Referral was placed by Dr. Ermalene Searing on 11/19/2020.

## 2020-12-06 NOTE — Assessment & Plan Note (Signed)
Chronic, has ache in bilateral legs.  Using compression hose.

## 2020-12-06 NOTE — Telephone Encounter (Signed)
error 

## 2020-12-06 NOTE — Assessment & Plan Note (Signed)
ON day 30/45 of treatment plan for 45 days of Xarelto given superficial phlebitis in greater saphenous vein, extending above knee. Elevating legs, wearing compression hose.  She has had improvement in pain and redness but not at baseline.  Recommended repeat US to evaluate for clot [progression or resolution but given patient chronic bialteral leg pain fom varicosities she would like to proceed to vascular consult for this as well as further recommendations on  Continued treatment of the current phlebitis.

## 2020-12-06 NOTE — Telephone Encounter (Signed)
Referral was placed by Dr. Ermalene Searing on 11/19/2020.  Referral, office note, insurance card and Venous Doppler report faxed to Dr. Jimmie Molly [phone].

## 2020-12-06 NOTE — Telephone Encounter (Signed)
Pt called stating that she needs the referral for the vascular surgery at [facility] with Dr Connye Burkitt. Pt states that her appointment is today at 11:00

## 2020-12-08 DIAGNOSIS — R600 Localized edema: Secondary | ICD-10-CM | POA: Diagnosis not present

## 2020-12-08 DIAGNOSIS — I8312 Varicose veins of left lower extremity with inflammation: Secondary | ICD-10-CM | POA: Diagnosis not present

## 2020-12-08 DIAGNOSIS — I8311 Varicose veins of right lower extremity with inflammation: Secondary | ICD-10-CM | POA: Diagnosis not present

## 2020-12-22 DIAGNOSIS — Z7982 Long term (current) use of aspirin: Secondary | ICD-10-CM | POA: Diagnosis not present

## 2020-12-22 DIAGNOSIS — G629 Polyneuropathy, unspecified: Secondary | ICD-10-CM | POA: Diagnosis not present

## 2020-12-22 DIAGNOSIS — R030 Elevated blood-pressure reading, without diagnosis of hypertension: Secondary | ICD-10-CM | POA: Diagnosis not present

## 2020-12-22 DIAGNOSIS — J309 Allergic rhinitis, unspecified: Secondary | ICD-10-CM | POA: Diagnosis not present

## 2020-12-22 DIAGNOSIS — I739 Peripheral vascular disease, unspecified: Secondary | ICD-10-CM | POA: Diagnosis not present

## 2020-12-22 DIAGNOSIS — Z008 Encounter for other general examination: Secondary | ICD-10-CM | POA: Diagnosis not present

## 2020-12-22 DIAGNOSIS — Z86718 Personal history of other venous thrombosis and embolism: Secondary | ICD-10-CM | POA: Diagnosis not present

## 2020-12-22 DIAGNOSIS — Z823 Family history of stroke: Secondary | ICD-10-CM | POA: Diagnosis not present

## 2020-12-22 DIAGNOSIS — Z8249 Family history of ischemic heart disease and other diseases of the circulatory system: Secondary | ICD-10-CM | POA: Diagnosis not present

## 2021-01-05 DIAGNOSIS — I83892 Varicose veins of left lower extremities with other complications: Secondary | ICD-10-CM | POA: Diagnosis not present

## 2021-01-05 DIAGNOSIS — I83812 Varicose veins of left lower extremities with pain: Secondary | ICD-10-CM | POA: Diagnosis not present

## 2021-01-05 DIAGNOSIS — I872 Venous insufficiency (chronic) (peripheral): Secondary | ICD-10-CM | POA: Diagnosis not present

## 2021-01-05 DIAGNOSIS — Z09 Encounter for follow-up examination after completed treatment for conditions other than malignant neoplasm: Secondary | ICD-10-CM | POA: Diagnosis not present

## 2021-01-12 DIAGNOSIS — I87391 Chronic venous hypertension (idiopathic) with other complications of right lower extremity: Secondary | ICD-10-CM | POA: Diagnosis not present

## 2021-01-12 DIAGNOSIS — I83891 Varicose veins of right lower extremities with other complications: Secondary | ICD-10-CM | POA: Diagnosis not present

## 2021-01-12 DIAGNOSIS — I87331 Chronic venous hypertension (idiopathic) with ulcer and inflammation of right lower extremity: Secondary | ICD-10-CM | POA: Diagnosis not present

## 2021-01-25 DIAGNOSIS — M79604 Pain in right leg: Secondary | ICD-10-CM | POA: Diagnosis not present

## 2021-01-25 DIAGNOSIS — I8001 Phlebitis and thrombophlebitis of superficial vessels of right lower extremity: Secondary | ICD-10-CM | POA: Diagnosis not present

## 2021-01-25 DIAGNOSIS — M7989 Other specified soft tissue disorders: Secondary | ICD-10-CM | POA: Diagnosis not present

## 2021-02-09 DIAGNOSIS — I83812 Varicose veins of left lower extremities with pain: Secondary | ICD-10-CM | POA: Diagnosis not present

## 2021-02-09 DIAGNOSIS — I83892 Varicose veins of left lower extremities with other complications: Secondary | ICD-10-CM | POA: Diagnosis not present

## 2021-02-09 DIAGNOSIS — M7989 Other specified soft tissue disorders: Secondary | ICD-10-CM | POA: Diagnosis not present

## 2021-02-15 DIAGNOSIS — I83811 Varicose veins of right lower extremities with pain: Secondary | ICD-10-CM | POA: Diagnosis not present

## 2021-02-15 DIAGNOSIS — M7989 Other specified soft tissue disorders: Secondary | ICD-10-CM | POA: Diagnosis not present

## 2021-02-15 DIAGNOSIS — I83891 Varicose veins of right lower extremities with other complications: Secondary | ICD-10-CM | POA: Diagnosis not present

## 2021-02-23 DIAGNOSIS — I83812 Varicose veins of left lower extremities with pain: Secondary | ICD-10-CM | POA: Diagnosis not present

## 2021-02-23 DIAGNOSIS — M7989 Other specified soft tissue disorders: Secondary | ICD-10-CM | POA: Diagnosis not present

## 2021-02-23 DIAGNOSIS — I83892 Varicose veins of left lower extremities with other complications: Secondary | ICD-10-CM | POA: Diagnosis not present

## 2021-03-02 DIAGNOSIS — I83811 Varicose veins of right lower extremities with pain: Secondary | ICD-10-CM | POA: Diagnosis not present

## 2021-03-02 DIAGNOSIS — M7989 Other specified soft tissue disorders: Secondary | ICD-10-CM | POA: Diagnosis not present

## 2021-03-02 DIAGNOSIS — I83891 Varicose veins of right lower extremities with other complications: Secondary | ICD-10-CM | POA: Diagnosis not present

## 2021-03-03 ENCOUNTER — Telehealth: Payer: Self-pay

## 2021-03-03 NOTE — Telephone Encounter (Signed)
[facility] - Client TELEPHONE ADVICE RECORD Access[facility] Patient Name: Angela Chapman Gender: Female DOB: 01-11-1951 Age: 71 Y 3 M 15 D Return Phone Number: [phone] (Primary) Address: City/ State/ Zip: [place] [address] Client [facility] - Client Client Site [facility] - Day Provider Kerby Nora - MD Contact Type Call Who Is Calling Patient / Member / Family / Caregiver Call Type Triage / Clinical Relationship To Patient Self Return Phone Number [phone] (Primary) Chief Complaint Poison [place], [place] Or [facility] Reason for Call Symptomatic / Request for Health Information Initial Comment Caller states that she has poison oak. She has a prescription and would like to know if she is able to use it. The medication is triamcinoloone cream. She has blisters on her hands and they are oozing. Translation No Nurse Assessment Nurse: [place], RN, Marchelle Folks Date/Time (Eastern Time): 03/03/2021 11:12:07 AM Confirm and document reason for call. If symptomatic, describe symptoms. ---caller states she has poison oak for 7 days on wrists, lower arm, hands. some across lip. triamcinolone cream 0.1% Does the patient have any new or worsening symptoms? ---Yes Will a triage be completed? ---Yes Related visit to physician within the last 2 weeks? ---Yes Does the PT have any chronic conditions? (i.e. diabetes, asthma, this includes High risk factors for pregnancy, etc.) ---No Is this a behavioral health or substance abuse call? ---No Guidelines Guideline Title Affirmed Question Affirmed Notes Nurse Date/Time (Eastern Time) Poison Ivy - Oak - Sumac [1] Severe poison ivy, oak, or sumac reaction in the past AND [2] face or genitals involved Humfleet, RN, Marchelle Folks 03/03/2021 11:12:38 AM Disp. Time Lamount Cohen Time) Disposition Final User 03/03/2021 11:01:33 AM Attempt made - message left D'Heur Margaretmary Dys 03/03/2021 11:05:54 AM Send To RN Personal D'Heur Ezzard Standing, RN, Adrienne PLEASE NOTE: All timestamps contained within this report are represented as [place] Standard Time. CONFIDENTIALTY NOTICE: This fax transmission is intended only for the addressee. It contains information that is legally privileged, confidential or otherwise protected from use or disclosure. If you are not the intended recipient, you are strictly prohibited from reviewing, disclosing, copying using or disseminating any of this information or taking any action in reliance on or regarding this information. If you have received this fax in error, please notify us immediately by telephone so that we can arrange for its return to [place]. Phone: [phone], Toll-Free: [phone], Fax: [phone] Page: 2 of 2 Call Id: [id number] 03/03/2021 11:16:41 AM See HCP within 4 Hours (or PCP triage) Yes Hum[place]et, RN, Earnestine Leys Disagree/Comply Comply Caller Understands Yes PreDisposition Did not know what to do Care Advice Given Per Guideline SEE HCP (OR PCP TRIAGE) WITHIN 4 HOURS: * IF OFFICE WILL BE OPEN: You need to be seen within the next 3 or 4 hours. Call your doctor (or NP/PA) now or as soon as the office opens. CARE ADVICE given per Poison Ivy - Oak - Sumac (Adult) guideline. * You become worse CALL BACK IF: Comments User: Jaclynn Major, RN Date/Time Lamount Cohen Time): 03/03/2021 11:20:35 AM said she saw improvement with a home remedy cream she tried. User: Jaclynn Major, RN Date/Time Lamount Cohen Time): 03/03/2021 11:22:24 AM patient is wanting to wait to call back if symptoms worsen. does not want appointment right now Referrals GO TO FACILITY REFUSE

## 2021-03-03 NOTE — Telephone Encounter (Signed)
I spoke with pt; pt said put family remedy on hand and wrapped hand so oozing will not spread. Pt said also thinks has a place due to using flea spray on  her hand that looks different from poison oak and there is a spot on pts lip; pt scheduled appt with Dr Ermalene Searing at [facility] 03/04/21 at 2 PM; pt will be at office at 1:45 for ck in. UC & ED precautions given and pt voiced understanding. No covid symptoms or known exposure. Pt said if completely cleared in morning pt will call at 8am to cancel appt otherwise pt will be seen.  Sending note to Dr Ermalene Searing who is out of office today and Lupita Leash CMA.

## 2021-03-04 ENCOUNTER — Other Ambulatory Visit: Payer: Self-pay

## 2021-03-04 ENCOUNTER — Encounter: Payer: Self-pay | Admitting: Family Medicine

## 2021-03-04 ENCOUNTER — Ambulatory Visit (INDEPENDENT_AMBULATORY_CARE_PROVIDER_SITE_OTHER): Payer: Medicare HMO | Admitting: Family Medicine

## 2021-03-04 VITALS — BP 130/70 | HR 65 | Temp 97.8°F | Ht 64.0 in | Wt 168.2 lb

## 2021-03-04 DIAGNOSIS — L237 Allergic contact dermatitis due to plants, except food: Secondary | ICD-10-CM | POA: Diagnosis not present

## 2021-03-04 DIAGNOSIS — R21 Rash and other nonspecific skin eruption: Secondary | ICD-10-CM

## 2021-03-04 MED ORDER — PREDNISONE 20 MG PO TABS: ORAL_TABLET | ORAL | 0 refills | Status: DC

## 2021-03-04 NOTE — Patient Instructions (Signed)
Start prednisone taper. Okay to continue heme remedy and covering.  If any lip or oral swelling or shortness of breath ... go to ER.

## 2021-03-04 NOTE — Progress Notes (Signed)
Patient ID: Angela Chapman, female    DOB: 14-Sep-1950, 70 y.o.   MRN: [id number]  This visit was conducted in person.  BP 130/70    Pulse 65    Temp 97.8 F (36.6 C) (Temporal)    Ht 5\' 4"  (1.626 m)    Wt 168 lb 4 oz (76.3 kg)    SpO2 97%    BMI 28.88 kg/m    CC:  Chief Complaint  Patient presents with   Rash    On face, hands, arms & outer ear    Subjective:   HPI: Angela Chapman is a 71 y.o. female presenting on 03/04/2021 for Rash (On face, hands, arms & outer ear)   She reports  new onset rash  on right palm.. treat ed with topical steroid and home remedy.. ongoing x 1 month   2 weeks ago after yard work... noted itchy blistering rash.  Very itchy.  Red bumpy patches appeared on her ear and lip... started before the  rash on  arms  Has not improved with triamcinolone  Has improved with  home remedy salve.camphor,  pine rosin and essential oils, petroleum   No fever, no flu like illness.  No oral swelling , no SOB.      Relevant past medical, surgical, family and social history reviewed and updated as indicated. Interim medical history since our last visit reviewed. Allergies and medications reviewed and updated. Outpatient Medications Prior to Visit  Medication Sig Dispense Refill   Calcium Carbonate-Vitamin D 600-400 MG-UNIT tablet Take 1 tablet by mouth 2 (two) times daily.     Elastic Bandages & Supports (V-2 HIGH COMPRESSION HOSE) MISC 15-30 mm HG compression, 1 pair wear daily, knee high 1 each 0   levocetirizine (XYZAL) 5 MG tablet Take 5 mg by mouth every evening.     sodium chloride (OCEAN) 0.65 % nasal spray Place 1 spray into the nose as needed for congestion.     triamcinolone cream (KENALOG) 0.1 % Apply 1 application topically 2 (two) times daily as needed.     rivaroxaban (XARELTO) 10 MG TABS tablet Take 1 tablet (10 mg total) by mouth daily. 45 tablet 0   No facility-administered medications prior to visit.     Per HPI unless specifically indicated in  ROS section below Review of Systems  Constitutional:  Negative for fatigue and fever.  HENT:  Negative for ear pain.   Eyes:  Negative for pain.  Respiratory:  Negative for chest tightness and shortness of breath.   Cardiovascular:  Negative for chest pain, palpitations and leg swelling.  Gastrointestinal:  Negative for abdominal pain.  Genitourinary:  Negative for dysuria.  Objective:  BP 130/70    Pulse 65    Temp 97.8 F (36.6 C) (Temporal)    Ht 5\' 4"  (1.626 m)    Wt 168 lb 4 oz (76.3 kg)    SpO2 97%    BMI 28.88 kg/m   Wt Readings from Last 3 Encounters:  03/04/21 168 lb 4 oz (76.3 kg)  11/19/20 165 lb (74.8 kg)  10/19/20 165 lb 4 oz (75 kg)      Physical Exam Constitutional:      General: She is not in acute distress.    Appearance: Normal appearance. She is well-developed. She is not ill-appearing or toxic-appearing.  HENT:     Head: Normocephalic.     Right Ear: Hearing, tympanic membrane, ear canal and external ear normal. Tympanic membrane is not erythematous, retracted  or bulging.     Left Ear: Hearing, tympanic membrane, ear canal and external ear normal. Tympanic membrane is not erythematous, retracted or bulging.     Nose: No mucosal edema or rhinorrhea.     Right Sinus: No maxillary sinus tenderness or frontal sinus tenderness.     Left Sinus: No maxillary sinus tenderness or frontal sinus tenderness.     Mouth/Throat:     Pharynx: Uvula midline.  Eyes:     General: Lids are normal. Lids are everted, no foreign bodies appreciated.     Conjunctiva/sclera: Conjunctivae normal.     Pupils: Pupils are equal, round, and reactive to light.  Neck:     Thyroid: No thyroid mass or thyromegaly.     Vascular: No carotid bruit.     Trachea: Trachea normal.  Cardiovascular:     Rate and Rhythm: Normal rate and regular rhythm.     Pulses: Normal pulses.     Heart sounds: Normal heart sounds, S1 normal and S2 normal. No murmur heard.   No friction rub. No gallop.   Pulmonary:     Effort: Pulmonary effort is normal. No tachypnea or respiratory distress.     Breath sounds: Normal breath sounds. No decreased breath sounds, wheezing, rhonchi or rales.  Abdominal:     General: Bowel sounds are normal.     Palpations: Abdomen is soft.     Tenderness: There is no abdominal tenderness.  Musculoskeletal:     Cervical back: Normal range of motion and neck supple.  Skin:    General: Skin is warm and dry.     Findings: No rash.  Neurological:     Mental Status: She is alert.  Psychiatric:        Mood and Affect: Mood is not anxious or depressed.        Speech: Speech normal.        Behavior: Behavior normal. Behavior is cooperative.        Thought Content: Thought content normal.        Judgment: Judgment normal.           Results for orders placed or performed in visit on 03/26/20  VITAMIN D 25 Hydroxy (Vit-D Deficiency, Fractures)  Result Value Ref Range   VITD 39.36 30.00 - 100.00 ng/mL  Comprehensive metabolic panel  Result Value Ref Range   Sodium 141 135 - 145 mEq/L   Potassium 4.1 3.5 - 5.1 mEq/L   Chloride 106 96 - 112 mEq/L   CO2 29 19 - 32 mEq/L   Glucose, Bld 88 70 - 99 mg/dL   BUN 18 6 - 23 mg/dL   Creatinine, Ser 0.74 0.40 - 1.20 mg/dL   Total Bilirubin 0.6 0.2 - 1.2 mg/dL   Alkaline Phosphatase 69 39 - 117 U/L   AST 20 0 - 37 U/L   ALT 17 0 - 35 U/L   Total Protein 6.5 6.0 - 8.3 g/dL   Albumin 4.0 3.5 - 5.2 g/dL   GFR 83.76 >60.00 mL/min   Calcium 9.3 8.4 - 10.5 mg/dL  Lipid panel  Result Value Ref Range   Cholesterol 195 0 - 200 mg/dL   Triglycerides 58.0 0.0 - 149.0 mg/dL   HDL 74.60 >39.00 mg/dL   VLDL 11.6 0.0 - 40.0 mg/dL   LDL Cholesterol 109 (H) 0 - 99 mg/dL   Total CHOL/HDL Ratio 3    NonHDL 120.46     This visit occurred during the SARS-CoV-2 public health emergency.  Safety protocols were in place, including screening questions prior to the visit, additional usage of staff PPE, and extensive cleaning of  exam room while observing appropriate contact time as indicated for disinfecting solutions.   COVID 19 screen:  No recent travel or known exposure to COVID19 The patient denies respiratory symptoms of COVID 19 at this time. The importance of social distancing was discussed today.   Assessment and Plan Problem List Items Addressed This Visit     Plant allergic contact dermatitis - Primary     Treat with prednisone taper. No oral swelling, no SOB, no sign of anaphylaxis. Return precautions given.      Rash of hand    Started before contact derm... eczema nummular vs fungal infection... if not improving with steroid.. consider topical antifungal.          Eliezer Lofts, MD

## 2021-03-04 NOTE — Assessment & Plan Note (Signed)
Started before contact derm... eczema nummular vs fungal infection... if not improving with steroid.. consider topical antifungal.

## 2021-03-04 NOTE — Assessment & Plan Note (Signed)
Treat with prednisone taper. No oral swelling, no SOB, no sign of anaphylaxis. Return precautions given.

## 2021-03-14 ENCOUNTER — Encounter: Payer: Self-pay | Admitting: Family Medicine

## 2021-03-25 NOTE — Progress Notes (Signed)
Subjective:   Angela Chapman is a 71 y.o. female who presents for Medicare Annual (Subsequent) preventive examination.  I connected with [name] today by telephone and verified that I am speaking with the correct person using two identifiers. Location patient: home Location provider: work Persons participating in the virtual visit: patient, Engineer, civil (consulting).    I discussed the limitations, risks, security and privacy concerns of performing an evaluation and management service by telephone and the availability of in person appointments. I also discussed with the patient that there may be a patient responsible charge related to this service. The patient expressed understanding and verbally consented to this telephonic visit.    Interactive audio and video telecommunications were attempted between this provider and patient, however failed, due to patient having technical difficulties OR patient did not have access to video capability.  We continued and completed visit with audio only.  Some vital signs may be absent or patient reported.   Time Spent with patient on telephone encounter: 20 minutes  Review of Systems           Objective:    Today's Vitals   03/28/21 1027  Weight: 168 lb (76.2 kg)  Height: 5\' 4"  (1.626 m)   Body mass index is 28.84 kg/m.  Advanced Directives 03/28/2021 03/26/2020  Does Patient Have a Medical Advance Directive? No No  Would patient like information on creating a medical advance directive? Yes (MAU/Ambulatory/Procedural Areas - Information given) No - Patient declined    Current Medications (verified) Outpatient Encounter Medications as of 03/28/2021  Medication Sig   Calcium Carbonate-Vitamin D 600-400 MG-UNIT tablet Take 1 tablet by mouth 2 (two) times daily.   Elastic Bandages & Supports (V-2 HIGH COMPRESSION HOSE) MISC 15-30 mm HG compression, 1 pair wear daily, knee high   levocetirizine (XYZAL) 5 MG tablet Take 5 mg by mouth every evening.   sodium  chloride (OCEAN) 0.65 % nasal spray Place 1 spray into the nose as needed for congestion.   predniSONE (DELTASONE) 20 MG tablet 3 tabs by mouth daily x 3 days, then 2 tabs by mouth daily x 2 days then 1 tab by mouth daily x 2 days (Patient not taking: Reported on 03/28/2021)   triamcinolone cream (KENALOG) 0.1 % Apply 1 application topically 2 (two) times daily as needed. (Patient not taking: Reported on 03/28/2021)   No facility-administered encounter medications on file as of 03/28/2021.    Allergies (verified) Oxycodone-acetaminophen and Sulfonamide derivatives   History: Past Medical History:  Diagnosis Date   Allergy    Blood transfusion without reported diagnosis    Broken ribs    fell and broke several ribs 12-23-12   Depression    no per pt   Fibromyalgia    GERD (gastroesophageal reflux disease)    Past Surgical History:  Procedure Laterality Date   ABDOMINAL HYSTERECTOMY     COLONOSCOPY     WRIST SURGERY     left   Family History  Problem Relation Age of Onset   Arthritis Mother    Cancer Mother        breast   Kidney disease Mother    Diabetes Mother    Breast cancer Mother 76   Stroke Father    Colon cancer Neg Hx    Esophageal cancer Neg Hx    Rectal cancer Neg Hx    Stomach cancer Neg Hx    Social History   Socioeconomic History   Marital status: Married    Spouse  name: Not on file   Number of children: Not on file   Years of education: Not on file   Highest education level: Not on file  Occupational History   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  Tobacco Use   Smoking status: Never   Smokeless tobacco: Never  Substance and Sexual Activity   Alcohol use: No   Drug use: No   Sexual activity: Not on file  Other Topics Concern   Not on file  Social History Narrative   Regular exercise yes 3 time weekly         Social Determinants of Health   Financial Resource Strain: Low Risk    Difficulty of Paying Living Expenses: Not hard at all  Food  Insecurity: No Food Insecurity   Worried About Charity fundraiser in the Last Year: Never true   [place] in the Last Year: Never true  Transportation Needs: No Transportation Needs   Lack of Transportation (Medical): No   Lack of Transportation (Non-Medical): No  Physical Activity: Sufficiently Active   Days of Exercise per Week: 7 days   Minutes of Exercise per Session: 30 min  Stress: No Stress Concern Present   Feeling of Stress : Not at all  Social Connections: Socially Integrated   Frequency of Communication with Friends and Family: More than three times a week   Frequency of Social Gatherings with Friends and Family: More than three times a week   Attends Religious Services: More than 4 times per year   Active Member of [employer] or Organizations: Yes   Attends Music therapist: More than 4 times per year   Marital Status: Married    Tobacco Counseling Counseling given: Not Answered   Clinical Intake:  Pre-visit preparation completed: Yes  Pain : No/denies pain     BMI - recorded: 28.84 Nutritional Status: BMI 25 -29 Overweight Nutritional Risks: None Diabetes: No  How often do you need to have someone help you when you read instructions, pamphlets, or other written materials from your doctor or pharmacy?: 1 - Never  Diabetic? No  Interpreter Needed?: No  Information entered by :: Orrin Brigham LPN   Activities of Daily Living In your present state of health, do you have any difficulty performing the following activities: 03/28/2021  Hearing? Y  Comment wears hearing aids  Vision? N  Difficulty concentrating or making decisions? N  Walking or climbing stairs? N  Dressing or bathing? N  Doing errands, shopping? N  Preparing Food and eating ? N  Using the Toilet? N  In the past six months, have you accidently leaked urine? N  Do you have problems with loss of bowel control? Y  Managing your Medications? N  Managing your Finances? N   Housekeeping or managing your Housekeeping? N  Some recent data might be hidden    Patient Care Team: Jinny Sanders, MD as PCP - General  Indicate any recent Medical Services you may have received from other than [facility] providers in the past year (date may be approximate).     Assessment:   This is a routine wellness examination for [place].  Hearing/Vision screen Hearing Screening - Comments:: Wears hearing aids Vision Screening - Comments:: Last exam 2022, upcoming appointment 03/29/21, Dr. Zadie Rhine   Dietary issues and exercise activities discussed: Current Exercise Habits: Home exercise routine, Type of exercise: walking;Other - see comments (aerobics), Time (Minutes): 30, Frequency (Times/Week): 7, Weekly Exercise (Minutes/Week): 210, Intensity: Moderate  Goals Addressed             This Visit's Progress    Patient Stated       Would like to increase steps to 5,000 steps a day. Would like to drink more water and decrease tea intake.        Depression Screen PHQ 2/9 Scores 03/28/2021 11/19/2020 03/26/2020 03/27/2019 03/01/2018  PHQ - 2 Score 0 0 0 0 0  PHQ- 9 Score - 0 0 - -    Fall Risk Fall Risk  03/28/2021 03/26/2020 03/27/2019 03/01/2018  Falls in the past year? 0 0 0 0  Number falls in past yr: 0 0 - -  Injury with Fall? 0 0 - -  Risk for fall due to : No Fall Risks No Fall Risks - -  Follow up Falls prevention discussed Falls evaluation completed;Falls prevention discussed - -    FALL RISK PREVENTION PERTAINING TO THE HOME:  Any stairs in or around the home? Yes  If so, are there any without handrails? No  Home free of loose throw rugs in walkways, pet beds, electrical cords, etc? Yes  Adequate lighting in your home to reduce risk of falls? Yes   ASSISTIVE DEVICES UTILIZED TO PREVENT FALLS:  Life alert? No  Use of a cane, walker or w/c? No  Grab bars in the bathroom? Yes  Shower chair or bench in shower? Yes  Elevated toilet seat or a handicapped toilet? Yes    TIMED UP AND GO:  Was the test performed? No .    Cognitive Function: Normal cognitive status assessed by  this Nurse Health Advisor. No abnormalities found.   MMSE - Mini Mental State Exam 03/26/2020  Not completed: Refused        Immunizations Immunization History  Administered Date(s) Administered   Fluad Quad(high Dose 65+) 11/28/2019   Influenza Split 12/14/2010, 11/07/2011   Influenza Whole 11/20/2008, 11/17/2009   Influenza, High Dose Seasonal PF 02/05/2018, 12/18/2018, 12/10/2020   Influenza,inj,Quad PF,6+ Mos 12/13/2014, 11/15/2015   Influenza-Unspecified 11/21/2018   Moderna Sars-Covid-2 Vaccination 02/02/2020   PFIZER(Purple Top)SARS-COV-2 Vaccination 06/02/2019, 06/23/2019   Pfizer Covid-19 Vaccine Bivalent Booster 26yrs & up 12/10/2020   Pneumococcal Conjugate-13 03/01/2018   Pneumococcal Polysaccharide-23 03/30/2020   Td 05/11/2008, 03/30/2020    TDAP status: Up to date  Flu Vaccine status: Up to date  Pneumococcal vaccine status: Up to date  Covid-19 vaccine status: Information provided on how to obtain vaccines.   Qualifies for Shingles Vaccine? Yes   Zostavax completed No   Shingrix Completed?: No.    Education has been provided regarding the importance of this vaccine. Patient has been advised to call insurance company to determine out of pocket expense if they have not yet received this vaccine. Advised may also receive vaccine at local pharmacy or Health Dept. Verbalized acceptance and understanding.  Screening Tests Health Maintenance  Topic Date Due   Zoster Vaccines- Shingrix (1 of 2) Never done   MAMMOGRAM  07/23/2020   COLONOSCOPY (Pts 45-67yrs Insurance coverage will need to be confirmed)  04/17/2023   TETANUS/TDAP  03/30/2030   Pneumonia Vaccine 68+ Years old  Completed   INFLUENZA VACCINE  Completed   DEXA SCAN  Completed   COVID-19 Vaccine  Completed   Hepatitis C Screening  Completed   HPV VACCINES  Aged Out    Health  Maintenance  Health Maintenance Due  Topic Date Due   Zoster Vaccines- Shingrix (1 of 2) Never done  MAMMOGRAM  07/23/2020    Colorectal cancer screening: Type of screening: Colonoscopy. Completed 04/16/13. Repeat every 10 years  Mammogram status: Ordered 03/28/21. Pt provided with contact info and advised to call to schedule appt.   Bone Density status: Completed 07/24/19. Results reflect: Bone density results: OSTEOPOROSIS. Repeat every 2 years.  Lung Cancer Screening: (Low Dose CT Chest recommended if Age 56-80 years, 30 pack-year currently smoking OR have quit w/in 15years.) does not qualify.     Additional Screening:  Hepatitis C Screening: does qualify; Completed 01/25/16  Vision Screening: Recommended annual ophthalmology exams for early detection of glaucoma and other disorders of the eye. Is the patient up to date with their annual eye exam?  Yes  Who is the provider or what is the name of the office in which the patient attends annual eye exams? Dr. Zadie Rhine   Dental Screening: Recommended annual dental exams for proper oral hygiene  Community Resource Referral / Chronic Care Management: CRR required this visit?  No   CCM required this visit?  No      Plan:     I have personally reviewed and noted the following in the patients chart:   Medical and social history Use of alcohol, tobacco or illicit drugs  Current medications and supplements including opioid prescriptions.  Functional ability and status Nutritional status Physical activity Advanced directives List of other physicians Hospitalizations, surgeries, and ER visits in previous 12 months Vitals Screenings to include cognitive, depression, and falls Referrals and appointments  In addition, I have reviewed and discussed with patient certain preventive protocols, quality metrics, and best practice recommendations. A written personalized care plan for preventive services as well as general preventive  health recommendations were provided to patient.   Due to this being a telephonic visit, the after visit summary with patients personalized plan was offered to patient via mail or my-chart. Patient would like to access on my-chart.   Loma Messing, LPN   [id number]   Nurse Health Advisor  Nurse Notes: none

## 2021-03-28 ENCOUNTER — Ambulatory Visit (INDEPENDENT_AMBULATORY_CARE_PROVIDER_SITE_OTHER): Payer: Medicare HMO

## 2021-03-28 VITALS — Ht 64.0 in | Wt 168.0 lb

## 2021-03-28 DIAGNOSIS — Z1231 Encounter for screening mammogram for malignant neoplasm of breast: Secondary | ICD-10-CM

## 2021-03-28 DIAGNOSIS — Z780 Asymptomatic menopausal state: Secondary | ICD-10-CM | POA: Diagnosis not present

## 2021-03-28 DIAGNOSIS — Z0000 Encounter for general adult medical examination without abnormal findings: Secondary | ICD-10-CM | POA: Diagnosis not present

## 2021-03-28 NOTE — Patient Instructions (Signed)
Angela Chapman , Thank you for taking time to complete your Medicare Wellness Visit. I appreciate your ongoing commitment to your health goals. Please review the following plan we discussed and let me know if I can assist you in the future.   Screening recommendations/referrals: Colonoscopy: up to date , completed 04/16/13, due 04/17/23 Mammogram: due, last completed 07/24/19, ordered today someone will call to schedule an appointment Bone Density: due, last completed 07/24/19, ordered today someone will call to schedule an appointment Recommended yearly ophthalmology/optometry visit for glaucoma screening and checkup Recommended yearly dental visit for hygiene and checkup  Vaccinations: Influenza vaccine: up to date  Pneumococcal vaccine: up to date  Tdap vaccine: up to date, completed 03/30/20, due 03/30/30 Shingles vaccine: May obtain vaccine at our your local pharmacy.   Covid-19:up to date  Advanced directives: Please bring a copy of Living Will and/or Healthcare Power of Attorney for your chart, when available.    Conditions/risks identified: see problem list   Next appointment: Follow up in one year for your annual wellness visit 03/29/22 @ 10:30am, this will be a telephone visit   Preventive Care 69 Years and Older, Female Preventive care refers to lifestyle choices and visits with your health care provider that can promote health and wellness. What does preventive care include? A yearly physical exam. This is also called an annual well check. Dental exams once or twice a year. Routine eye exams. Ask your health care provider how often you should have your eyes checked. Personal lifestyle choices, including: Daily care of your teeth and gums. Regular physical activity. Eating a healthy diet. Avoiding tobacco and drug use. Limiting alcohol use. Practicing safe sex. Taking low-dose aspirin every day. Taking vitamin and mineral supplements as recommended by your health care  provider. What happens during an annual well check? The services and screenings done by your health care provider during your annual well check will depend on your age, overall health, lifestyle risk factors, and family history of disease. Counseling  Your health care provider may ask you questions about your: Alcohol use. Tobacco use. Drug use. Emotional well-being. Home and relationship well-being. Sexual activity. Eating habits. History of falls. Memory and ability to understand (cognition). Work and work Astronomer. Reproductive health. Screening  You may have the following tests or measurements: Height, weight, and BMI. Blood pressure. Lipid and cholesterol levels. These may be checked every 5 years, or more frequently if you are over 23 years old. Skin check. Lung cancer screening. You may have this screening every year starting at age 69 if you have a 30-pack-year history of smoking and currently smoke or have quit within the past 15 years. Fecal occult blood test (FOBT) of the stool. You may have this test every year starting at age 69. Flexible sigmoidoscopy or colonoscopy. You may have a sigmoidoscopy every 5 years or a colonoscopy every 10 years starting at age 69. Hepatitis C blood test. Hepatitis B blood test. Sexually transmitted disease (STD) testing. Diabetes screening. This is done by checking your blood sugar (glucose) after you have not eaten for a while (fasting). You may have this done every 1-3 years. Bone density scan. This is done to screen for osteoporosis. You may have this done starting at age 69. Mammogram. This may be done every 1-2 years. Talk to your health care provider about how often you should have regular mammograms. Talk with your health care provider about your test results, treatment options, and if necessary, the need for more tests.  Vaccines  Your health care provider may recommend certain vaccines, such as: Influenza vaccine. This is  recommended every year. Tetanus, diphtheria, and acellular pertussis (Tdap, Td) vaccine. You may need a Td booster every 10 years. Zoster vaccine. You may need this after age 69. Pneumococcal 13-valent conjugate (PCV13) vaccine. One dose is recommended after age 69. Pneumococcal polysaccharide (PPSV23) vaccine. One dose is recommended after age 69. Talk to your health care provider about which screenings and vaccines you need and how often you need them. This information is not intended to replace advice given to you by your health care provider. Make sure you discuss any questions you have with your health care provider. Document Released: 03/05/2015 Document Revised: 10/27/2015 Document Reviewed: 12/08/2014 Elsevier Interactive Patient Education  2017 [employer] Prevention in the Home Falls can cause injuries. They can happen to people of all ages. There are many things you can do to make your home safe and to help prevent falls. What can I do on the outside of my home? Regularly fix the edges of walkways and driveways and fix any cracks. Remove anything that might make you trip as you walk through a door, such as a raised step or threshold. Trim any bushes or trees on the path to your home. Use bright outdoor lighting. Clear any walking paths of anything that might make someone trip, such as rocks or tools. Regularly check to see if handrails are loose or broken. Make sure that both sides of any steps have handrails. Any raised decks and porches should have guardrails on the edges. Have any leaves, snow, or ice cleared regularly. Use sand or salt on walking paths during winter. Clean up any spills in your garage right away. This includes oil or grease spills. What can I do in the bathroom? Use night lights. Install grab bars by the toilet and in the tub and shower. Do not use towel bars as grab bars. Use non-skid mats or decals in the tub or shower. If you need to sit down in  the shower, use a plastic, non-slip stool. Keep the floor dry. Clean up any water that spills on the floor as soon as it happens. Remove soap buildup in the tub or shower regularly. Attach bath mats securely with double-sided non-slip rug tape. Do not have throw rugs and other things on the floor that can make you trip. What can I do in the bedroom? Use night lights. Make sure that you have a light by your bed that is easy to reach. Do not use any sheets or blankets that are too big for your bed. They should not hang down onto the floor. Have a firm chair that has side arms. You can use this for support while you get dressed. Do not have throw rugs and other things on the floor that can make you trip. What can I do in the kitchen? Clean up any spills right away. Avoid walking on wet floors. Keep items that you use a lot in easy-to-reach places. If you need to reach something above you, use a strong step stool that has a grab bar. Keep electrical cords out of the way. Do not use floor polish or wax that makes floors slippery. If you must use wax, use non-skid floor wax. Do not have throw rugs and other things on the floor that can make you trip. What can I do with my stairs? Do not leave any items on the stairs. Make sure that  there are handrails on both sides of the stairs and use them. Fix handrails that are broken or loose. Make sure that handrails are as long as the stairways. Check any carpeting to make sure that it is firmly attached to the stairs. Fix any carpet that is loose or worn. Avoid having throw rugs at the top or bottom of the stairs. If you do have throw rugs, attach them to the floor with carpet tape. Make sure that you have a light switch at the top of the stairs and the bottom of the stairs. If you do not have them, ask someone to add them for you. What else can I do to help prevent falls? Wear shoes that: Do not have high heels. Have rubber bottoms. Are comfortable  and fit you well. Are closed at the toe. Do not wear sandals. If you use a stepladder: Make sure that it is fully opened. Do not climb a closed stepladder. Make sure that both sides of the stepladder are locked into place. Ask someone to hold it for you, if possible. Clearly mark and make sure that you can see: Any grab bars or handrails. First and last steps. Where the edge of each step is. Use tools that help you move around (mobility aids) if they are needed. These include: Canes. Walkers. Scooters. Crutches. Turn on the lights when you go into a dark area. Replace any light bulbs as soon as they burn out. Set up your furniture so you have a clear path. Avoid moving your furniture around. If any of your floors are uneven, fix them. If there are any pets around you, be aware of where they are. Review your medicines with your doctor. Some medicines can make you feel dizzy. This can increase your chance of falling. Ask your doctor what other things that you can do to help prevent falls. This information is not intended to replace advice given to you by your health care provider. Make sure you discuss any questions you have with your health care provider. Document Released: 12/03/2008 Document Revised: 07/15/2015 Document Reviewed: 03/13/2014 Elsevier Interactive Patient Education  2017 [employer].

## 2021-03-29 DIAGNOSIS — H524 Presbyopia: Secondary | ICD-10-CM | POA: Diagnosis not present

## 2021-03-30 ENCOUNTER — Other Ambulatory Visit: Payer: Medicare HMO

## 2021-04-01 ENCOUNTER — Encounter: Payer: Self-pay | Admitting: Family Medicine

## 2021-04-01 ENCOUNTER — Other Ambulatory Visit: Payer: Self-pay

## 2021-04-01 ENCOUNTER — Ambulatory Visit (INDEPENDENT_AMBULATORY_CARE_PROVIDER_SITE_OTHER): Payer: Medicare HMO | Admitting: Family Medicine

## 2021-04-01 VITALS — BP 118/76 | HR 62 | Temp 98.4°F | Ht 63.75 in | Wt 167.0 lb

## 2021-04-01 DIAGNOSIS — E559 Vitamin D deficiency, unspecified: Secondary | ICD-10-CM

## 2021-04-01 DIAGNOSIS — F325 Major depressive disorder, single episode, in full remission: Secondary | ICD-10-CM

## 2021-04-01 DIAGNOSIS — Z0000 Encounter for general adult medical examination without abnormal findings: Secondary | ICD-10-CM | POA: Diagnosis not present

## 2021-04-01 DIAGNOSIS — R21 Rash and other nonspecific skin eruption: Secondary | ICD-10-CM | POA: Diagnosis not present

## 2021-04-01 DIAGNOSIS — E7800 Pure hypercholesterolemia, unspecified: Secondary | ICD-10-CM

## 2021-04-01 DIAGNOSIS — R69 Illness, unspecified: Secondary | ICD-10-CM | POA: Diagnosis not present

## 2021-04-01 LAB — LIPID PANEL
Cholesterol: 199 mg/dL (ref 0–200)
HDL: 77.3 mg/dL (ref >39.00)
LDL Cholesterol: 109 mg/dL — ABNORMAL HIGH (ref 0–99)
NonHDL: 122.05
Total CHOL/HDL Ratio: 3
Triglycerides: 63 mg/dL (ref 0.0–149.0)
VLDL: 12.6 mg/dL (ref 0.0–40.0)

## 2021-04-01 LAB — COMPREHENSIVE METABOLIC PANEL
ALT: 17 U/L (ref 0–35)
AST: 22 U/L (ref 0–37)
Albumin: 4 g/dL (ref 3.5–5.2)
Alkaline Phosphatase: 79 U/L (ref 39–117)
BUN: 18 mg/dL (ref 6–23)
CO2: 33 mEq/L — ABNORMAL HIGH (ref 19–32)
Calcium: 9.6 mg/dL (ref 8.4–10.5)
Chloride: 104 mEq/L (ref 96–112)
Creatinine, Ser: 0.73 mg/dL (ref 0.40–1.20)
GFR: 84.53 mL/min (ref >60.00)
Glucose, Bld: 84 mg/dL (ref 70–99)
Potassium: 4.6 mEq/L (ref 3.5–5.1)
Sodium: 141 mEq/L (ref 135–145)
Total Bilirubin: 0.5 mg/dL (ref 0.2–1.2)
Total Protein: 6.4 g/dL (ref 6.0–8.3)

## 2021-04-01 LAB — VITAMIN D 25 HYDROXY (VIT D DEFICIENCY, FRACTURES): VITD: 45.93 ng/mL (ref 30.00–100.00)

## 2021-04-01 NOTE — Assessment & Plan Note (Signed)
No better with triamcinolone cream. Possible fungal infection. Restarted topical antifungal cream and it looks like it is improving.. If not improving in 3 weeks consider referral to derm or oral antifungal.

## 2021-04-01 NOTE — Patient Instructions (Addendum)
Please stop at the lab to have labs drawn.  Consider Shingrix when feeling better.  Please call the location of your choice from the menu below to schedule your Mammogram and/or Bone Density appointment.    [facility]   [facility]                      Phone:  [phone] [address]                               [place], [place] [address]                                                             Services: Traditional and 3D Mammogram, Bone Density   [facility]                 Phone: [phone] [address]                                                       [place], [place] [address]    Service: Bone Density ONLY   *this site does NOT perform mammograms  [facility]                        Phone:  [phone] [address]                                  [place], [place] [address]                                            Services:  3D Mammogram and Bone Density    [facility]   Phone:  [phone]   [address]                                                                            [place], [place] [address]                                            Services: 3D Mammogram and Bone [facility])  Phone:  [phone]   [address]                        [place], [place] [address]  Services:  3D Mammogram and Bone Density

## 2021-04-01 NOTE — Progress Notes (Signed)
Patient ID: Angela Chapman, female    DOB: 19-Feb-1951, 71 y.o.   MRN: [id number]  This visit was conducted in person.  BP 118/76 (BP Location: Left Arm, Patient Position: Sitting, Cuff Size: Normal)    Pulse 62    Temp 98.4 F (36.9 C) (Oral)    Ht 5' 3.75" (1.619 m)    Wt 167 lb (75.8 kg)    SpO2 98%    BMI 28.89 kg/m    CC: Chief Complaint  Patient presents with   AWV Part 2 Annual Exam    Needs same day labs     Subjective:   HPI: Angela Chapman is a 71 y.o. female presenting on 04/01/2021 for AWV Part 2 Annual Exam (Needs same day labs/)  The patient saw a LPN or RN for medicare wellness visit.  Prevention and wellness was reviewed in detail. Note reviewed and important notes copied below.  Due for cholesterol recheck as well as test of vit D.     Diet:  Healthy, increased water Exercise: walking  daily.   Wt Readings from Last 3 Encounters:  04/01/21 167 lb (75.8 kg)  03/28/21 168 lb (76.2 kg)  03/04/21 168 lb 4 oz (76.3 kg)    Relevant past medical, surgical, family and social history reviewed and updated as indicated. Interim medical history since our last visit reviewed. Allergies and medications reviewed and updated. Outpatient Medications Prior to Visit  Medication Sig Dispense Refill   Bioflavonoid Products (BIOFLEX PO) Take by mouth daily. Takes 1 pill daily     Calcium Carbonate-Vitamin D 600-400 MG-UNIT tablet Take 1 tablet by mouth 2 (two) times daily.     Elastic Bandages & Supports (V-2 HIGH COMPRESSION HOSE) MISC 15-30 mm HG compression, 1 pair wear daily, knee high 1 each 0   levocetirizine (XYZAL) 5 MG tablet Take 5 mg by mouth every evening.     sodium chloride (OCEAN) 0.65 % nasal spray Place 1 spray into the nose as needed for congestion.     triamcinolone cream (KENALOG) 0.1 % Apply 1 application topically 2 (two) times daily as needed. (Patient not taking: Reported on 03/28/2021)     predniSONE (DELTASONE) 20 MG tablet 3 tabs by mouth daily x 3 days,  then 2 tabs by mouth daily x 2 days then 1 tab by mouth daily x 2 days (Patient not taking: Reported on 03/28/2021) 15 tablet 0   No facility-administered medications prior to visit.     Per HPI unless specifically indicated in ROS section below Review of Systems  Constitutional:  Negative for fatigue and fever.  HENT:  Negative for ear pain.   Eyes:  Negative for pain.  Respiratory:  Negative for chest tightness and shortness of breath.   Cardiovascular:  Negative for chest pain, palpitations and leg swelling.  Gastrointestinal:  Negative for abdominal pain.  Genitourinary:  Negative for dysuria.  Objective:  BP 118/76 (BP Location: Left Arm, Patient Position: Sitting, Cuff Size: Normal)    Pulse 62    Temp 98.4 F (36.9 C) (Oral)    Ht 5' 3.75" (1.619 m)    Wt 167 lb (75.8 kg)    SpO2 98%    BMI 28.89 kg/m   Wt Readings from Last 3 Encounters:  04/01/21 167 lb (75.8 kg)  03/28/21 168 lb (76.2 kg)  03/04/21 168 lb 4 oz (76.3 kg)      Physical Exam Vitals and nursing note reviewed.  Constitutional:  General: She is not in acute distress.    Appearance: Normal appearance. She is well-developed. She is not ill-appearing or toxic-appearing.  HENT:     Head: Normocephalic.     Right Ear: Hearing, tympanic membrane, ear canal and external ear normal.     Left Ear: Hearing, tympanic membrane, ear canal and external ear normal.     Nose: Nose normal.  Eyes:     General: Lids are normal. Lids are everted, no foreign bodies appreciated.     Conjunctiva/sclera: Conjunctivae normal.     Pupils: Pupils are equal, round, and reactive to light.  Neck:     Thyroid: No thyroid mass or thyromegaly.     Vascular: No carotid bruit.     Trachea: Trachea normal.  Cardiovascular:     Rate and Rhythm: Normal rate and regular rhythm.     Heart sounds: Normal heart sounds, S1 normal and S2 normal. No murmur heard.   No gallop.  Pulmonary:     Effort: Pulmonary effort is normal. No respiratory  distress.     Breath sounds: Normal breath sounds. No wheezing, rhonchi or rales.  Abdominal:     General: Bowel sounds are normal. There is no distension or abdominal bruit.     Palpations: Abdomen is soft. There is no fluid wave or mass.     Tenderness: There is no abdominal tenderness. There is no guarding or rebound.     Hernia: No hernia is present.  Musculoskeletal:     Cervical back: Normal range of motion and neck supple.  Lymphadenopathy:     Cervical: No cervical adenopathy.  Skin:    General: Skin is warm and dry.     Findings: No rash.     Comments:  Dry flaky erythematous rash on right palm, 1 cm diameter  Neurological:     Mental Status: She is alert.     Cranial Nerves: No cranial nerve deficit.     Sensory: No sensory deficit.  Psychiatric:        Mood and Affect: Mood is not anxious or depressed.        Speech: Speech normal.        Behavior: Behavior normal. Behavior is cooperative.        Judgment: Judgment normal.      Results for orders placed or performed in visit on 03/26/20  VITAMIN D 25 Hydroxy (Vit-D Deficiency, Fractures)  Result Value Ref Range   VITD 39.36 30.00 - 100.00 ng/mL  Comprehensive metabolic panel  Result Value Ref Range   Sodium 141 135 - 145 mEq/L   Potassium 4.1 3.5 - 5.1 mEq/L   Chloride 106 96 - 112 mEq/L   CO2 29 19 - 32 mEq/L   Glucose, Bld 88 70 - 99 mg/dL   BUN 18 6 - 23 mg/dL   Creatinine, Ser 0.74 0.40 - 1.20 mg/dL   Total Bilirubin 0.6 0.2 - 1.2 mg/dL   Alkaline Phosphatase 69 39 - 117 U/L   AST 20 0 - 37 U/L   ALT 17 0 - 35 U/L   Total Protein 6.5 6.0 - 8.3 g/dL   Albumin 4.0 3.5 - 5.2 g/dL   GFR 83.76 >60.00 mL/min   Calcium 9.3 8.4 - 10.5 mg/dL  Lipid panel  Result Value Ref Range   Cholesterol 195 0 - 200 mg/dL   Triglycerides 58.0 0.0 - 149.0 mg/dL   HDL 74.60 >39.00 mg/dL   VLDL 11.6 0.0 - 40.0 mg/dL  LDL Cholesterol 109 (H) 0 - 99 mg/dL   Total CHOL/HDL Ratio 3    NonHDL 120.46     This visit occurred  during the SARS-CoV-2 public health emergency.  Safety protocols were in place, including screening questions prior to the visit, additional usage of staff PPE, and extensive cleaning of exam room while observing appropriate contact time as indicated for disinfecting solutions.   COVID 19 screen:  No recent travel or known exposure to COVID19 The patient denies respiratory symptoms of COVID 19 at this time. The importance of social distancing was discussed today.   Assessment and Plan   The patient's preventative maintenance and recommended screening tests for an annual wellness exam were reviewed in full today. Brought up to date unless services declined.  Counselled on the importance of diet, exercise, and its role in overall health and mortality. The patient's FH and SH was reviewed, including their home life, tobacco status, and drug and alcohol status.   Vaccines: uptodate except shingirx. Encouraged COVID vaccine. Pap/DVE:  Not indicated TAH Mammo: 07/2019, repeat q2 years DUE Bone Density: osteoporosis in hip 07/2019. She is not interested in  treatment. Repeat due. Colon: 2015 Dr. Ardis Hughs, nml, repeat in 10 years..  Smoking Status: none  HIV screen:   refused. Hep C: negative  Problem List Items Addressed This Visit     Depression, major, in remission ([place])   PURE HYPERCHOLESTEROLEMIA   Relevant Orders   Lipid panel   Comprehensive metabolic panel   Rash of hand     No better with triamcinolone cream. Possible fungal infection. Restarted topical antifungal cream and it looks like it is improving.. If not improving in 3 weeks consider referral to derm or oral antifungal.      Other Visit Diagnoses     Routine general medical examination at a health care facility    -  Primary   Vitamin D deficiency       Relevant Orders   VITAMIN D 25 Hydroxy (Vit-D Deficiency, Fractures)        Eliezer Lofts, MD

## 2021-06-30 ENCOUNTER — Ambulatory Visit (INDEPENDENT_AMBULATORY_CARE_PROVIDER_SITE_OTHER): Payer: Medicare HMO | Admitting: Family Medicine

## 2021-06-30 ENCOUNTER — Encounter: Payer: Self-pay | Admitting: Family Medicine

## 2021-06-30 VITALS — BP 130/84 | HR 76 | Ht 63.75 in | Wt 165.8 lb

## 2021-06-30 DIAGNOSIS — J0100 Acute maxillary sinusitis, unspecified: Secondary | ICD-10-CM | POA: Insufficient documentation

## 2021-06-30 MED ORDER — AMOXICILLIN 500 MG PO CAPS: 1000.0000 mg | ORAL_CAPSULE | Freq: Two times a day (BID) | ORAL | 0 refills | Status: DC

## 2021-06-30 MED ORDER — PREDNISONE 20 MG PO TABS: ORAL_TABLET | ORAL | 0 refills | Status: DC

## 2021-06-30 NOTE — Assessment & Plan Note (Signed)
Acute, worsening control ? ?Symptoms ongoing greater than 7 to 10 days.  We will start with prednisone taper to help with eustachian tube drainage and sinus pressure.  She will continue mucolytic and use nasal saline to flush sinuses.  If she is not improving as expected in 48 to 72 hours she will fill prescription for amoxicillin. ?Return precautions provided. ?

## 2021-06-30 NOTE — Progress Notes (Signed)
? ? Patient ID: Angela Chapman, female    DOB: August 09, 1950, 71 y.o.   MRN: [id number] ? ?This visit was conducted in person. ? ?BP 130/84   Pulse 76   Ht 5' 3.75" (1.619 m)   Wt 165 lb 12.8 oz (75.2 kg)   SpO2 96%   BMI 28.68 kg/m?   ? ?CC:  ?Chief Complaint  ?Patient presents with  ? Nasal Congestion  ?  Started about 1 week ago, pt did a covid test was neg. , runny nose , no fever, some coughing/ mucus, ears feel full , taking sinus allergy medication as not helping   ? ? ?Subjective:  ? ?HPI: ?Angela Chapman is a 71 y.o. female presenting on 06/30/2021 for Nasal Congestion (Started about 1 week ago, pt did a covid test was neg. , runny nose , no fever, some coughing/ mucus, ears feel full , taking sinus allergy medication as not helping ) ? ?Date of onset: 1 week ?Initial symptoms: Started with irritation in left throat. ? Nasal congestion,  productive cough. ? No fever ? Bilateral facial pain. ? Bilateral ear fullness,  occ pain. Decreased hearing ?  No SOB, no wheeze. ? ? Using peroxide gargle x several days. ? ?Has tried to treat with usual allergie meds.Marland Kitchen allegra,  cannot tolerate nasal spray. ?   ?Negative home COVID test  06/30/2021 ? ?Relevant past medical, surgical, family and social history reviewed and updated as indicated. Interim medical history since our last visit reviewed. ?Allergies and medications reviewed and updated. ?Outpatient Medications Prior to Visit  ?Medication Sig Dispense Refill  ? Bioflavonoid Products (BIOFLEX PO) Take by mouth daily. Takes 1 pill daily    ? Calcium Carbonate-Vitamin D 600-400 MG-UNIT tablet Take 1 tablet by mouth 2 (two) times daily.    ? Elastic Bandages & Supports (V-2 HIGH COMPRESSION HOSE) MISC 15-30 mm HG compression, 1 pair wear daily, knee high 1 each 0  ? levocetirizine (XYZAL) 5 MG tablet Take 5 mg by mouth every evening.    ? sodium chloride (OCEAN) 0.65 % nasal spray Place 1 spray into the nose as needed for congestion.    ? triamcinolone cream (KENALOG) 0.1  % Apply 1 application. topically 2 (two) times daily as needed.    ? ?No facility-administered medications prior to visit.  ?  ? ?Per HPI unless specifically indicated in ROS section below ?Review of Systems  ?Constitutional:  Negative for fatigue and fever.  ?HENT:  Negative for congestion.   ?Eyes:  Negative for pain.  ?Respiratory:  Negative for cough and shortness of breath.   ?Cardiovascular:  Negative for chest pain, palpitations and leg swelling.  ?Gastrointestinal:  Negative for abdominal pain.  ?Genitourinary:  Negative for dysuria and vaginal bleeding.  ?Musculoskeletal:  Negative for back pain.  ?Neurological:  Negative for syncope, light-headedness and headaches.  ?Psychiatric/Behavioral:  Negative for dysphoric mood.   ?Objective:  ?BP 130/84   Pulse 76   Ht 5' 3.75" (1.619 m)   Wt 165 lb 12.8 oz (75.2 kg)   SpO2 96%   BMI 28.68 kg/m?   ?Wt Readings from Last 3 Encounters:  ?06/30/21 165 lb 12.8 oz (75.2 kg)  ?04/01/21 167 lb (75.8 kg)  ?03/28/21 168 lb (76.2 kg)  ?  ?  ?Physical Exam ?Constitutional:   ?   General: She is not in acute distress. ?   Appearance: Normal appearance. She is well-developed. She is not ill-appearing or toxic-appearing.  ?HENT:  ?   Head:  Normocephalic.  ?   Right Ear: Hearing, ear canal and external ear normal. A middle ear effusion is present. Tympanic membrane is not erythematous, retracted or bulging.  ?   Left Ear: Hearing, ear canal and external ear normal. A middle ear effusion is present. Tympanic membrane is not erythematous, retracted or bulging.  ?   Nose: Mucosal edema and rhinorrhea present.  ?   Right Turbinates: Swollen.  ?   Left Turbinates: Swollen.  ?   Right Sinus: No maxillary sinus tenderness or frontal sinus tenderness.  ?   Left Sinus: No maxillary sinus tenderness or frontal sinus tenderness.  ?   Mouth/Throat:  ?   Lips: Pink.  ?   Mouth: Mucous membranes are moist.  ?   Tongue: No lesions.  ?   Palate: No mass.  ?   Pharynx: Oropharynx is clear.  Uvula midline.  ?Eyes:  ?   General: Lids are normal. Lids are everted, no foreign bodies appreciated.  ?   Conjunctiva/sclera: Conjunctivae normal.  ?   Pupils: Pupils are equal, round, and reactive to light.  ?Neck:  ?   Thyroid: No thyroid mass or thyromegaly.  ?   Vascular: No carotid bruit.  ?   Trachea: Trachea normal.  ?Cardiovascular:  ?   Rate and Rhythm: Normal rate and regular rhythm.  ?   Pulses: Normal pulses.  ?   Heart sounds: Normal heart sounds, S1 normal and S2 normal. No murmur heard. ?  No friction rub. No gallop.  ?Pulmonary:  ?   Effort: Pulmonary effort is normal. No tachypnea or respiratory distress.  ?   Breath sounds: Normal breath sounds. No decreased breath sounds, wheezing, rhonchi or rales.  ?Abdominal:  ?   General: Bowel sounds are normal.  ?   Palpations: Abdomen is soft.  ?   Tenderness: There is no abdominal tenderness.  ?Musculoskeletal:  ?   Cervical back: Normal range of motion and neck supple.  ?Skin: ?   General: Skin is warm and dry.  ?   Findings: No rash.  ?Neurological:  ?   Mental Status: She is alert.  ?Psychiatric:     ?   Mood and Affect: Mood is not anxious or depressed.     ?   Speech: Speech normal.     ?   Behavior: Behavior normal. Behavior is cooperative.     ?   Thought Content: Thought content normal.     ?   Judgment: Judgment normal.  ? ?   ?Results for orders placed or performed in visit on 04/01/21  ?Lipid panel  ?Result Value Ref Range  ? Cholesterol 199 0 - 200 mg/dL  ? Triglycerides 63.0 0.0 - 149.0 mg/dL  ? HDL 77.30 >39.00 mg/dL  ? VLDL 12.6 0.0 - 40.0 mg/dL  ? LDL Cholesterol 109 (H) 0 - 99 mg/dL  ? Total CHOL/HDL Ratio 3   ? NonHDL 122.05   ?Comprehensive metabolic panel  ?Result Value Ref Range  ? Sodium 141 135 - 145 mEq/L  ? Potassium 4.6 3.5 - 5.1 mEq/L  ? Chloride 104 96 - 112 mEq/L  ? CO2 33 (H) 19 - 32 mEq/L  ? Glucose, Bld 84 70 - 99 mg/dL  ? BUN 18 6 - 23 mg/dL  ? Creatinine, Ser 0.73 0.40 - 1.20 mg/dL  ? Total Bilirubin 0.5 0.2 - 1.2 mg/dL   ? Alkaline Phosphatase 79 39 - 117 U/L  ? AST 22 0 - 37 U/L  ?  ALT 17 0 - 35 U/L  ? Total Protein 6.4 6.0 - 8.3 g/dL  ? Albumin 4.0 3.5 - 5.2 g/dL  ? GFR 84.53 >60.00 mL/min  ? Calcium 9.6 8.4 - 10.5 mg/dL  ?VITAMIN D 25 Hydroxy (Vit-D Deficiency, Fractures)  ?Result Value Ref Range  ? VITD 45.93 30.00 - 100.00 ng/mL  ? ? ? ?COVID 19 screen:  No recent travel or known exposure to COVID19 ?The patient denies respiratory symptoms of COVID 19 at this time. ?The importance of social distancing was discussed today.  ? ?Assessment and Plan ? ?  ?Problem List Items Addressed This Visit   ? ? Acute non-recurrent maxillary sinusitis - Primary  ?  Acute, worsening control ? ?Symptoms ongoing greater than 7 to 10 days.  We will start with prednisone taper to help with eustachian tube drainage and sinus pressure.  She will continue mucolytic and use nasal saline to flush sinuses.  If she is not improving as expected in 48 to 72 hours she will fill prescription for amoxicillin. ?Return precautions provided. ? ?  ?  ? Relevant Medications  ? predniSONE (DELTASONE) 20 MG tablet  ? amoxicillin (AMOXIL) 500 MG capsule  ? ?Meds ordered this encounter  ?Medications  ? predniSONE (DELTASONE) 20 MG tablet  ?  Sig: 3 tabs by mouth daily x 3 days, then 2 tabs by mouth daily x 2 days then 1 tab by mouth daily x 2 days  ?  Dispense:  15 tablet  ?  Refill:  0  ? amoxicillin (AMOXIL) 500 MG capsule  ?  Sig: Take 2 capsules (1,000 mg total) by mouth 2 (two) times daily.  ?  Dispense:  40 capsule  ?  Refill:  0  ? ? ? ?Kerby Nora, MD  ? ?

## 2021-08-26 ENCOUNTER — Ambulatory Visit (INDEPENDENT_AMBULATORY_CARE_PROVIDER_SITE_OTHER): Payer: Medicare HMO | Admitting: Family Medicine

## 2021-08-26 ENCOUNTER — Encounter: Payer: Self-pay | Admitting: Family Medicine

## 2021-08-26 ENCOUNTER — Ambulatory Visit: Payer: Medicare HMO | Admitting: Primary Care

## 2021-08-26 VITALS — BP 134/76 | HR 82 | Temp 98.6°F | Ht 63.75 in | Wt 166.0 lb

## 2021-08-26 DIAGNOSIS — R21 Rash and other nonspecific skin eruption: Secondary | ICD-10-CM | POA: Diagnosis not present

## 2021-08-26 DIAGNOSIS — W57XXXA Bitten or stung by nonvenomous insect and other nonvenomous arthropods, initial encounter: Secondary | ICD-10-CM | POA: Diagnosis not present

## 2021-08-26 MED ORDER — DOXYCYCLINE HYCLATE 100 MG PO TABS
100.0000 mg | ORAL_TABLET | Freq: Two times a day (BID) | ORAL | 0 refills | Status: DC
Start: 2021-08-26 — End: 2021-12-12

## 2021-08-26 NOTE — Progress Notes (Unsigned)
Tick bite about 3 weeks ago. Now with a spot in the area, in the meantime.  Local pinkish changes without ulceration.  No fevers but had hot flashes.  Joint pain, R 2nd and 3rd MC.  Hands are stiff and achy.  No cough, vomiting, blood in stool.  No chills.    Meds, vitals, and allergies reviewed.   ROS: Per HPI unless specifically indicated in ROS section   Nad Ncat Neck supple, no LA Rrr Ctab Abd soft 3x2cm blanching irritation at L lower back at the tick bite site without any retained foreign body or ulceration.

## 2021-08-26 NOTE — Patient Instructions (Signed)
Go to the lab on the way out.   If you have mychart we'll likely use that to update you.    Take care.  Glad to see you. Start doxycycline and be careful about getting sunburned.

## 2021-08-28 NOTE — Assessment & Plan Note (Signed)
Discussed situation.  Given the joint pain and the persistent skin changes we will start doxycycline with routine cautions and check tick related labs.  Still okay for outpatient follow-up.  She agrees with plan.  Routine cautions given to patient.

## 2021-08-31 LAB — ROCKY MTN SPOTTED FVR ABS PNL(IGG+IGM)
RMSF IgG: NOT DETECTED
RMSF IgM: NOT DETECTED

## 2021-08-31 LAB — B. BURGDORFI ANTIBODIES: B burgdorferi Ab IgG+IgM: <0.9 index

## 2021-11-22 DIAGNOSIS — I872 Venous insufficiency (chronic) (peripheral): Secondary | ICD-10-CM | POA: Diagnosis not present

## 2021-11-22 DIAGNOSIS — I83893 Varicose veins of bilateral lower extremities with other complications: Secondary | ICD-10-CM | POA: Diagnosis not present

## 2021-11-22 DIAGNOSIS — G2581 Restless legs syndrome: Secondary | ICD-10-CM | POA: Diagnosis not present

## 2021-11-22 DIAGNOSIS — R252 Cramp and spasm: Secondary | ICD-10-CM | POA: Diagnosis not present

## 2021-12-12 ENCOUNTER — Other Ambulatory Visit: Payer: Self-pay | Admitting: *Deleted

## 2022-02-22 ENCOUNTER — Telehealth: Payer: Self-pay | Admitting: Family Medicine

## 2022-02-22 DIAGNOSIS — E7800 Pure hypercholesterolemia, unspecified: Secondary | ICD-10-CM

## 2022-02-22 DIAGNOSIS — E559 Vitamin D deficiency, unspecified: Secondary | ICD-10-CM

## 2022-02-22 NOTE — Telephone Encounter (Signed)
Patient is scheduled for [facility] labs on 03/31/22,she would like to know if she can have orders placed for her to go to the [place] location to get theses labs done?

## 2022-02-23 NOTE — Telephone Encounter (Signed)
Please let patient know that labs have been ordered.  She should call you them to make lab appointment if they are agreeable.

## 2022-02-27 NOTE — Telephone Encounter (Signed)
Angela Chapman notified as instructed by telephone.  She will call and schedule at [facility].

## 2022-03-29 ENCOUNTER — Telehealth: Payer: Self-pay | Admitting: Family Medicine

## 2022-03-29 ENCOUNTER — Ambulatory Visit (INDEPENDENT_AMBULATORY_CARE_PROVIDER_SITE_OTHER): Payer: Medicare HMO

## 2022-03-29 VITALS — Ht 64.5 in | Wt 162.0 lb

## 2022-03-29 DIAGNOSIS — Z0000 Encounter for general adult medical examination without abnormal findings: Secondary | ICD-10-CM

## 2022-03-29 DIAGNOSIS — R790 Abnormal level of blood mineral: Secondary | ICD-10-CM

## 2022-03-29 NOTE — Patient Instructions (Signed)
Angela Chapman , Thank you for taking time to come for your Medicare Wellness Visit. I appreciate your ongoing commitment to your health goals. Please review the following plan we discussed and let me know if I can assist you in the future.   These are the goals we discussed:  Goals      Patient Stated     03/26/2020, I will continue to walk 2-3 times a day for about 30 minutes.      Patient Stated     Would like to increase steps to 5,000 steps a day. Would like to drink more water and decrease tea intake.      Patient Stated     03/29/2022, wants to weigh 155 pounds        This is a list of the screening recommended for you and due dates:  Health Maintenance  Topic Date Due   Mammogram  07/23/2020   COVID-19 Vaccine (6 - 2023-24 season) 01/13/2022   Medicare Annual Wellness Visit  03/30/2023   Colon Cancer Screening  04/17/2023   DTaP/Tdap/Td vaccine (3 - Tdap) 03/30/2030   Pneumonia Vaccine  Completed   Flu Shot  Completed   DEXA scan (bone density measurement)  Completed   Hepatitis C Screening: USPSTF Recommendation to screen - Ages 22-79 yo.  Completed   Zoster (Shingles) Vaccine  Completed   HPV Vaccine  Aged Out    Advanced directives: Advance directive discussed with you today.   Conditions/risks identified: none  Next appointment: Follow up in one year for your annual wellness visit    Preventive Care 65 Years and Older, Female Preventive care refers to lifestyle choices and visits with your health care provider that can promote health and wellness. What does preventive care include? A yearly physical exam. This is also called an annual well check. Dental exams once or twice a year. Routine eye exams. Ask your health care provider how often you should have your eyes checked. Personal lifestyle choices, including: Daily care of your teeth and gums. Regular physical activity. Eating a healthy diet. Avoiding tobacco and drug use. Limiting alcohol use. Practicing  safe sex. Taking low-dose aspirin every day. Taking vitamin and mineral supplements as recommended by your health care provider. What happens during an annual well check? The services and screenings done by your health care provider during your annual well check will depend on your age, overall health, lifestyle risk factors, and family history of disease. Counseling  Your health care provider may ask you questions about your: Alcohol use. Tobacco use. Drug use. Emotional well-being. Home and relationship well-being. Sexual activity. Eating habits. History of falls. Memory and ability to understand (cognition). Work and work Statistician. Reproductive health. Screening  You may have the following tests or measurements: Height, weight, and BMI. Blood pressure. Lipid and cholesterol levels. These may be checked every 5 years, or more frequently if you are over 13 years old. Skin check. Lung cancer screening. You may have this screening every year starting at age 31 if you have a 30-pack-year history of smoking and currently smoke or have quit within the past 15 years. Fecal occult blood test (FOBT) of the stool. You may have this test every year starting at age 69. Flexible sigmoidoscopy or colonoscopy. You may have a sigmoidoscopy every 5 years or a colonoscopy every 10 years starting at age 68. Hepatitis C blood test. Hepatitis B blood test. Sexually transmitted disease (STD) testing. Diabetes screening. This is done by checking your blood  sugar (glucose) after you have not eaten for a while (fasting). You may have this done every 1-3 years. Bone density scan. This is done to screen for osteoporosis. You may have this done starting at age 63. Mammogram. This may be done every 1-2 years. Talk to your health care provider about how often you should have regular mammograms. Talk with your health care provider about your test results, treatment options, and if necessary, the need for more  tests. Vaccines  Your health care provider may recommend certain vaccines, such as: Influenza vaccine. This is recommended every year. Tetanus, diphtheria, and acellular pertussis (Tdap, Td) vaccine. You may need a Td booster every 10 years. Zoster vaccine. You may need this after age 44. Pneumococcal 13-valent conjugate (PCV13) vaccine. One dose is recommended after age 75. Pneumococcal polysaccharide (PPSV23) vaccine. One dose is recommended after age 42. Talk to your health care provider about which screenings and vaccines you need and how often you need them. This information is not intended to replace advice given to you by your health care provider. Make sure you discuss any questions you have with your health care provider. Document Released: 03/05/2015 Document Revised: 10/27/2015 Document Reviewed: 12/08/2014 Elsevier Interactive Patient Education  2017 [place] Prevention in the Home Falls can cause injuries. They can happen to people of all ages. There are many things you can do to make your home safe and to help prevent falls. What can I do on the outside of my home? Regularly fix the edges of walkways and driveways and fix any cracks. Remove anything that might make you trip as you walk through a door, such as a raised step or threshold. Trim any bushes or trees on the path to your home. Use bright outdoor lighting. Clear any walking paths of anything that might make someone trip, such as rocks or tools. Regularly check to see if handrails are loose or broken. Make sure that both sides of any steps have handrails. Any raised decks and porches should have guardrails on the edges. Have any leaves, snow, or ice cleared regularly. Use sand or salt on walking paths during winter. Clean up any spills in your garage right away. This includes oil or grease spills. What can I do in the bathroom? Use night lights. Install grab bars by the toilet and in the tub and shower.  Do not use towel bars as grab bars. Use non-skid mats or decals in the tub or shower. If you need to sit down in the shower, use a plastic, non-slip stool. Keep the floor dry. Clean up any water that spills on the floor as soon as it happens. Remove soap buildup in the tub or shower regularly. Attach bath mats securely with double-sided non-slip rug tape. Do not have throw rugs and other things on the floor that can make you trip. What can I do in the bedroom? Use night lights. Make sure that you have a light by your bed that is easy to reach. Do not use any sheets or blankets that are too big for your bed. They should not hang down onto the floor. Have a firm chair that has side arms. You can use this for support while you get dressed. Do not have throw rugs and other things on the floor that can make you trip. What can I do in the kitchen? Clean up any spills right away. Avoid walking on wet floors. Keep items that you use a lot in easy-to-reach  places. If you need to reach something above you, use a strong step stool that has a grab bar. Keep electrical cords out of the way. Do not use floor polish or wax that makes floors slippery. If you must use wax, use non-skid floor wax. Do not have throw rugs and other things on the floor that can make you trip. What can I do with my stairs? Do not leave any items on the stairs. Make sure that there are handrails on both sides of the stairs and use them. Fix handrails that are broken or loose. Make sure that handrails are as long as the stairways. Check any carpeting to make sure that it is firmly attached to the stairs. Fix any carpet that is loose or worn. Avoid having throw rugs at the top or bottom of the stairs. If you do have throw rugs, attach them to the floor with carpet tape. Make sure that you have a light switch at the top of the stairs and the bottom of the stairs. If you do not have them, ask someone to add them for you. What else  can I do to help prevent falls? Wear shoes that: Do not have high heels. Have rubber bottoms. Are comfortable and fit you well. Are closed at the toe. Do not wear sandals. If you use a stepladder: Make sure that it is fully opened. Do not climb a closed stepladder. Make sure that both sides of the stepladder are locked into place. Ask someone to hold it for you, if possible. Clearly mark and make sure that you can see: Any grab bars or handrails. First and last steps. Where the edge of each step is. Use tools that help you move around (mobility aids) if they are needed. These include: Canes. Walkers. Scooters. Crutches. Turn on the lights when you go into a dark area. Replace any light bulbs as soon as they burn out. Set up your furniture so you have a clear path. Avoid moving your furniture around. If any of your floors are uneven, fix them. If there are any pets around you, be aware of where they are. Review your medicines with your doctor. Some medicines can make you feel dizzy. This can increase your chance of falling. Ask your doctor what other things that you can do to help prevent falls. This information is not intended to replace advice given to you by your health care provider. Make sure you discuss any questions you have with your health care provider. Document Released: 12/03/2008 Document Revised: 07/15/2015 Document Reviewed: 03/13/2014 Elsevier Interactive Patient Education  2017 [employer].

## 2022-03-29 NOTE — Telephone Encounter (Signed)
-----   Message from Kellie Simmering, LPN sent at 02/22/7515 12:18 PM EST ----- Regarding: labs Good afternoon Dr. Diona Browner, Mrs. Risko would like to have her sugar and magnesium checked when she gets her labs done on Friday. Thank you

## 2022-03-29 NOTE — Progress Notes (Signed)
I connected with Angela Chapman today by telephone and verified that I am speaking with the correct person using two identifiers. Location patient: home Location provider: work Persons participating in the virtual visit: Angela Chapman, Glenna Durand LPN.   I discussed the limitations, risks, security and privacy concerns of performing an evaluation and management service by telephone and the availability of in person appointments. I also discussed with the patient that there may be a patient responsible charge related to this service. The patient expressed understanding and verbally consented to this telephonic visit.    Interactive audio and video telecommunications were attempted between this provider and patient, however failed, due to patient having technical difficulties OR patient did not have access to video capability.  We continued and completed visit with audio only.     Vital signs may be patient reported or missing.  Subjective:   Angela Chapman is a 72 y.o. female who presents for Medicare Annual (Subsequent) preventive examination.  Review of Systems     Cardiac Risk Factors include: advanced age (>74men, >36 women)     Objective:    Today's Vitals   03/29/22 1156  Weight: 162 lb (73.5 kg)  Height: 5' 4.5" (1.638 m)   Body mass index is 27.38 kg/m.     03/29/2022   12:06 PM 03/28/2021   10:34 AM 03/26/2020   10:27 AM  Advanced Directives  Does Patient Have a Medical Advance Directive? No No No  Would patient like information on creating a medical advance directive?  Yes (MAU/Ambulatory/Procedural Areas - Information given) No - Patient declined    Current Medications (verified) Outpatient Encounter Medications as of 03/29/2022  Medication Sig   Elastic Bandages & Supports (V-2 HIGH COMPRESSION HOSE) MISC 15-30 mm HG compression, 1 pair wear daily, knee high   fexofenadine (ALLEGRA) 180 MG tablet Take 180 mg by mouth daily.   Multiple Vitamin (MULTIVITAMIN WITH  MINERALS) TABS tablet Take 1 tablet by mouth daily.   Multiple Vitamins-Minerals (HAIR SKIN AND NAILS FORMULA PO) Take by mouth.   sodium chloride (OCEAN) 0.65 % nasal spray Place 1 spray into the nose as needed for congestion.   Calcium Carbonate-Vitamin D 600-400 MG-UNIT tablet Take 1 tablet by mouth 2 (two) times daily. (Patient not taking: Reported on 03/29/2022)   No facility-administered encounter medications on file as of 03/29/2022.    Allergies (verified) Oxycodone-acetaminophen and Sulfonamide derivatives   History: Past Medical History:  Diagnosis Date   Allergy    Blood transfusion without reported diagnosis    Broken ribs    fell and broke several ribs 12-23-12   Depression    no per pt   Fibromyalgia    GERD (gastroesophageal reflux disease)    Past Surgical History:  Procedure Laterality Date   ABDOMINAL HYSTERECTOMY     COLONOSCOPY     WRIST SURGERY     left   Family History  Problem Relation Age of Onset   Arthritis Mother    Cancer Mother        breast   Kidney disease Mother    Diabetes Mother    Breast cancer Mother 63   Stroke Father    Colon cancer Neg Hx    Esophageal cancer Neg Hx    Rectal cancer Neg Hx    Stomach cancer Neg Hx    Social History   Socioeconomic History   Marital status: Married    Spouse name: Not on file   Number of children: Not on  file   Years of education: Not on file   Highest education level: Not on file  Occupational History   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  Tobacco Use   Smoking status: Never   Smokeless tobacco: Never  Vaping Use   Vaping Use: Never used  Substance and Sexual Activity   Alcohol use: No   Drug use: No   Sexual activity: Not on file  Other Topics Concern   Not on file  Social History Narrative   Regular exercise yes 3 time weekly         Social Determinants of Health   Financial Resource Strain: Low Risk  (03/29/2022)   Overall Financial Resource Strain (CARDIA)    Difficulty of  Paying Living Expenses: Not hard at all  Food Insecurity: No Food Insecurity (03/29/2022)   Hunger Vital Sign    Worried About Running Out of Food in the Last Year: Never true    Ran Out of Food in the Last Year: Never true  Transportation Needs: No Transportation Needs (03/29/2022)   PRAPARE - Hydrologist (Medical): No    Lack of Transportation (Non-Medical): No  Physical Activity: Insufficiently Active (03/29/2022)   Exercise Vital Sign    Days of Exercise per Week: 4 days    Minutes of Exercise per Session: 30 min  Stress: No Stress Concern Present (03/29/2022)   [place]    Feeling of Stress : Not at all  Social Connections: [place] (03/28/2021)   Social Connection and Isolation Panel [NHANES]    Frequency of Communication with Friends and Family: More than three times a week    Frequency of Social Gatherings with Friends and Family: More than three times a week    Attends Religious Services: More than 4 times per year    Active Member of [employer] or Organizations: Yes    Attends Music therapist: More than 4 times per year    Marital Status: Married    Tobacco Counseling Counseling given: Not Answered   Clinical Intake:  Pre-visit preparation completed: Yes  Pain : No/denies pain     Nutritional Status: BMI 25 -29 Overweight Nutritional Risks: None Diabetes: No  How often do you need to have someone help you when you read instructions, pamphlets, or other written materials from your doctor or pharmacy?: 1 - Never  Diabetic? no  Interpreter Needed?: No  Information entered by :: NAllen LPN   Activities of Daily Living    03/29/2022   12:08 PM  In your present state of health, do you have any difficulty performing the following activities:  Hearing? 0  Vision? 0  Difficulty concentrating or making decisions? 0  Walking or climbing stairs? 0   Dressing or bathing? 0  Doing errands, shopping? 0  Preparing Food and eating ? N  Using the Toilet? N  In the past six months, have you accidently leaked urine? Y  Do you have problems with loss of bowel control? N  Managing your Medications? N  Managing your Finances? N  Housekeeping or managing your Housekeeping? N    Patient Care Team: Jinny Sanders, MD as PCP - General  Indicate any recent Medical Services you may have received from other than [facility] providers in the past year (date may be approximate).     Assessment:   This is a routine wellness examination for Narcissa.  Hearing/Vision screen Vision Screening - Comments::  Regular eye exams, [facility]  Dietary issues and exercise activities discussed: Current Exercise Habits: Home exercise routine, Type of exercise: treadmill;stretching, Time (Minutes): 30, Frequency (Times/Week): 4, Weekly Exercise (Minutes/Week): 120   Goals Addressed             This Visit's Progress    Patient Stated       03/29/2022, wants to weigh 155 pounds       Depression Screen    03/29/2022   12:07 PM 06/30/2021   10:28 AM 04/01/2021   12:11 PM 03/28/2021   10:36 AM 11/19/2020   11:12 AM 03/26/2020   10:28 AM 03/27/2019   11:01 AM  PHQ 2/9 Scores  PHQ - 2 Score 0 0 0 0 0 0 0  PHQ- 9 Score   0  0 0     Fall Risk    03/29/2022   12:07 PM 03/28/2021   10:35 AM 03/26/2020   10:28 AM 03/27/2019   11:01 AM 03/01/2018    2:54 PM  Fall Risk   Falls in the past year? 0 0 0 0 0  Number falls in past yr: 0 0 0    Injury with Fall? 0 0 0    Risk for fall due to : No Fall Risks No Fall Risks No Fall Risks    Follow up Falls prevention discussed;Education provided;Falls evaluation completed Falls prevention discussed Falls evaluation completed;Falls prevention discussed      FALL RISK PREVENTION PERTAINING TO THE HOME:  Any stairs in or around the home? Yes  If so, are there any without handrails? No  Home free of loose throw rugs in walkways,  pet beds, electrical cords, etc? Yes  Adequate lighting in your home to reduce risk of falls? Yes   ASSISTIVE DEVICES UTILIZED TO PREVENT FALLS:  Life alert? No  Use of a cane, walker or w/c? No  Grab bars in the bathroom? Yes  Shower chair or bench in shower? Yes  Elevated toilet seat or a handicapped toilet? Yes   TIMED UP AND GO:  Was the test performed? No .      Cognitive Function:    03/26/2020   10:31 AM  MMSE - Mini Mental State Exam  Not completed: Refused        03/29/2022   12:11 PM  6CIT Screen  What Year? 0 points  What month? 0 points  What time? 0 points  Count back from 20 0 points  Months in reverse 0 points  Repeat phrase 0 points  Total Score 0 points    Immunizations Immunization History  Administered Date(s) Administered   Fluad Quad(high Dose 65+) 11/28/2019   Influenza Split 12/14/2010, 11/07/2011   Influenza Whole 11/20/2008, 11/17/2009   Influenza, High Dose Seasonal PF 02/05/2018, 12/18/2018, 12/10/2020, 11/18/2021   Influenza,inj,Quad PF,6+ Mos 12/13/2014, 11/15/2015   Influenza-Unspecified 11/21/2018   Moderna Sars-Covid-2 Vaccination 02/02/2020   PFIZER(Purple Top)SARS-COV-2 Vaccination 06/02/2019, 06/23/2019   Pfizer Covid-19 Vaccine Bivalent Booster 70yrs & up 12/10/2020   Pneumococcal Conjugate-13 03/01/2018   Pneumococcal Polysaccharide-23 03/30/2020   Respiratory Syncytial Virus Vaccine,Recomb Aduvanted(Arexvy) 12/10/2021   Td 05/11/2008, 03/30/2020   Unspecified SARS-COV-2 Vaccination 11/18/2021   Zoster Recombinat (Shingrix) 07/26/2021, 12/10/2021    TDAP status: Up to date  Flu Vaccine status: Up to date  Pneumococcal vaccine status: Up to date  Covid-19 vaccine status: Completed vaccines  Qualifies for Shingles Vaccine? Yes   Zostavax completed Yes   Shingrix Completed?: Yes  Screening Tests Health  Maintenance  Topic Date Due   MAMMOGRAM  07/23/2020   COVID-19 Vaccine (6 - 2023-24 season) 01/13/2022    Medicare Annual Wellness (AWV)  03/28/2022   COLONOSCOPY (Pts 45-64yrs Insurance coverage will need to be confirmed)  04/17/2023   DTaP/Tdap/Td (3 - Tdap) 03/30/2030   Pneumonia Vaccine 10+ Years old  Completed   INFLUENZA VACCINE  Completed   DEXA SCAN  Completed   Hepatitis C Screening  Completed   Zoster Vaccines- Shingrix  Completed   HPV VACCINES  Aged Out    Health Maintenance  Health Maintenance Due  Topic Date Due   MAMMOGRAM  07/23/2020   COVID-19 Vaccine (6 - 2023-24 season) 01/13/2022   Medicare Annual Wellness (AWV)  03/28/2022    Colorectal cancer screening: Type of screening: Colonoscopy. Completed 04/16/2013. Repeat every 10 years  Mammogram status: due  Bone Density status: Completed 07/24/2019.   Lung Cancer Screening: (Low Dose CT Chest recommended if Age 58-80 years, 30 pack-year currently smoking OR have quit w/in 15years.) does not qualify.   Lung Cancer Screening Referral: no  Additional Screening:  Hepatitis C Screening: does qualify; Completed 01/25/2016  Vision Screening: Recommended annual ophthalmology exams for early detection of glaucoma and other disorders of the eye. Is the patient up to date with their annual eye exam?  No  Who is the provider or what is the name of the office in which the patient attends annual eye exams? [facility] If pt is not established with a provider, would they like to be referred to a provider to establish care? No .   Dental Screening: Recommended annual dental exams for proper oral hygiene  Community Resource Referral / Chronic Care Management: CRR required this visit?  No   CCM required this visit?  No      Plan:     I have personally reviewed and noted the following in the patient's chart:   Medical and social history Use of alcohol, tobacco or illicit drugs  Current medications and supplements including opioid prescriptions. Patient is not currently taking opioid prescriptions. Functional ability and  status Nutritional status Physical activity Advanced directives List of other physicians Hospitalizations, surgeries, and ER visits in previous 12 months Vitals Screenings to include cognitive, depression, and falls Referrals and appointments  In addition, I have reviewed and discussed with patient certain preventive protocols, quality metrics, and best practice recommendations. A written personalized care plan for preventive services as well as general preventive health recommendations were provided to patient.     Kellie Simmering, LPN   04/24/4560   Nurse Notes: Due to this being a virtual visit, the after visit summary with patients personalized plan was offered to patient via mail or my-chart. to pick up at office at next visit

## 2022-03-31 ENCOUNTER — Other Ambulatory Visit: Payer: Medicare HMO

## 2022-03-31 ENCOUNTER — Other Ambulatory Visit (INDEPENDENT_AMBULATORY_CARE_PROVIDER_SITE_OTHER): Payer: Medicare HMO

## 2022-03-31 DIAGNOSIS — E7800 Pure hypercholesterolemia, unspecified: Secondary | ICD-10-CM | POA: Diagnosis not present

## 2022-03-31 DIAGNOSIS — R790 Abnormal level of blood mineral: Secondary | ICD-10-CM

## 2022-03-31 DIAGNOSIS — E559 Vitamin D deficiency, unspecified: Secondary | ICD-10-CM

## 2022-03-31 LAB — COMPREHENSIVE METABOLIC PANEL
ALT: 20 U/L (ref 0–35)
AST: 24 U/L (ref 0–37)
Albumin: 4.2 g/dL (ref 3.5–5.2)
Alkaline Phosphatase: 72 U/L (ref 39–117)
BUN: 14 mg/dL (ref 6–23)
CO2: 30 mEq/L (ref 19–32)
Calcium: 9.5 mg/dL (ref 8.4–10.5)
Chloride: 102 mEq/L (ref 96–112)
Creatinine, Ser: 0.81 mg/dL (ref 0.40–1.20)
GFR: 74.09 mL/min (ref >60.00)
Glucose, Bld: 86 mg/dL (ref 70–99)
Potassium: 4.5 mEq/L (ref 3.5–5.1)
Sodium: 140 mEq/L (ref 135–145)
Total Bilirubin: 0.7 mg/dL (ref 0.2–1.2)
Total Protein: 6.8 g/dL (ref 6.0–8.3)

## 2022-03-31 LAB — LIPID PANEL
Cholesterol: 203 mg/dL — ABNORMAL HIGH (ref 0–200)
HDL: 72.9 mg/dL (ref >39.00)
LDL Cholesterol: 118 mg/dL — ABNORMAL HIGH (ref 0–99)
NonHDL: 129.61
Total CHOL/HDL Ratio: 3
Triglycerides: 57 mg/dL (ref 0.0–149.0)
VLDL: 11.4 mg/dL (ref 0.0–40.0)

## 2022-03-31 LAB — VITAMIN D 25 HYDROXY (VIT D DEFICIENCY, FRACTURES): VITD: 35 ng/mL (ref 30.00–100.00)

## 2022-03-31 LAB — MAGNESIUM: Magnesium: 2.1 mg/dL (ref 1.5–2.5)

## 2022-03-31 NOTE — Progress Notes (Signed)
No critical labs need to be addressed urgently. We will discuss labs in detail at upcoming office visit.   

## 2022-04-06 ENCOUNTER — Encounter: Payer: Self-pay | Admitting: Family Medicine

## 2022-04-06 ENCOUNTER — Ambulatory Visit (INDEPENDENT_AMBULATORY_CARE_PROVIDER_SITE_OTHER): Payer: Medicare HMO | Admitting: Family Medicine

## 2022-04-06 VITALS — BP 138/66 | HR 80 | Temp 97.8°F | Ht 64.0 in | Wt 162.4 lb

## 2022-04-06 DIAGNOSIS — R69 Illness, unspecified: Secondary | ICD-10-CM | POA: Diagnosis not present

## 2022-04-06 DIAGNOSIS — E559 Vitamin D deficiency, unspecified: Secondary | ICD-10-CM

## 2022-04-06 DIAGNOSIS — Z0000 Encounter for general adult medical examination without abnormal findings: Secondary | ICD-10-CM

## 2022-04-06 DIAGNOSIS — F325 Major depressive disorder, single episode, in full remission: Secondary | ICD-10-CM | POA: Diagnosis not present

## 2022-04-06 DIAGNOSIS — M810 Age-related osteoporosis without current pathological fracture: Secondary | ICD-10-CM

## 2022-04-06 DIAGNOSIS — E7800 Pure hypercholesterolemia, unspecified: Secondary | ICD-10-CM | POA: Diagnosis not present

## 2022-04-06 NOTE — Progress Notes (Signed)
Patient ID: Angela Chapman, female    DOB: [birth year], 72 y.o.   MRN: [id number]  This visit was conducted in person.  BP 138/66   Pulse 80   Temp 97.8 F (36.6 C) (Temporal)   Ht 5' 4"$  (1.626 m)   Wt 162 lb 6 oz (73.7 kg)   SpO2 99%   BMI 27.87 kg/m    CC: Chief Complaint  Patient presents with   Annual Exam    Part 2 ([place] 03-29-22)    Subjective:   HPI: Angela Chapman is a 71 y.o. female presenting on 04/06/2022 for Annual Exam (Part 2 ([place] 03-29-22))  The patient presents for complete physical and review of chronic health problems. He/She also has the following acute concerns today: none  The patient saw a LPN or RN for medicare wellness visit.  Prevention and wellness was reviewed in detail. Note reviewed and important notes copied below.  Elevated Cholesterol: Lab Results  Component Value Date   CHOL 203 (H) 03/31/2022   HDL 72.90 03/31/2022   LDLCALC 118 (H) 03/31/2022   TRIG 57.0 03/31/2022   CHOLHDL 3 03/31/2022   The 10-year ASCVD risk score (Arnett DK, et al., 2019) is: 9.2%   Values used to calculate the score:     Age: 46 years     Sex: Female     Is Non-Hispanic African American: No     Diabetic: No     Tobacco smoker: No     Systolic Blood Pressure: 0000000 mmHg     Is BP treated: No     HDL Cholesterol: 72.9 mg/dL     Total Cholesterol: 203 mg/dL Using medications without problems: Muscle aches:  Diet compliance: heart healthy Exercise:  gym/walk 3-4 times a week Other complaints:     Diet:  Healthy, increased water Exercise: walking  daily.   Wt Readings from Last 3 Encounters:  04/06/22 162 lb 6 oz (73.7 kg)  03/29/22 162 lb (73.5 kg)  08/26/21 166 lb (75.3 kg)    Relevant past medical, surgical, family and social history reviewed and updated as indicated. Interim medical history since our last visit reviewed. Allergies and medications reviewed and updated. Outpatient Medications Prior to Visit  Medication Sig Dispense Refill   Elastic  Bandages & Supports (V-2 HIGH COMPRESSION HOSE) MISC 15-30 mm HG compression, 1 pair wear daily, knee high 1 each 0   fexofenadine (ALLEGRA) 180 MG tablet Take 180 mg by mouth daily.     Multiple Vitamin (MULTIVITAMIN WITH MINERALS) TABS tablet Take 1 tablet by mouth daily.     Multiple Vitamins-Minerals (HAIR SKIN AND NAILS FORMULA PO) Take 2 tablets by mouth daily.     sodium chloride (OCEAN) 0.65 % nasal spray Place 1 spray into the nose as needed for congestion.     Calcium Carbonate-Vitamin D 600-400 MG-UNIT tablet Take 1 tablet by mouth 2 (two) times daily. (Patient not taking: Reported on 03/29/2022)     No facility-administered medications prior to visit.     Per HPI unless specifically indicated in ROS section below Review of Systems  Constitutional:  Negative for fatigue and fever.  HENT:  Negative for ear pain.   Eyes:  Negative for pain.  Respiratory:  Negative for chest tightness and shortness of breath.   Cardiovascular:  Negative for chest pain, palpitations and leg swelling.  Gastrointestinal:  Negative for abdominal pain.  Genitourinary:  Negative for dysuria.   Objective:  BP 138/66   Pulse 80  Temp 97.8 F (36.6 C) (Temporal)   Ht 5' 4"$  (1.626 m)   Wt 162 lb 6 oz (73.7 kg)   SpO2 99%   BMI 27.87 kg/m   Wt Readings from Last 3 Encounters:  04/06/22 162 lb 6 oz (73.7 kg)  03/29/22 162 lb (73.5 kg)  08/26/21 166 lb (75.3 kg)      Physical Exam Vitals and nursing note reviewed.  Constitutional:      General: She is not in acute distress.    Appearance: Normal appearance. She is well-developed. She is not ill-appearing or toxic-appearing.  HENT:     Head: Normocephalic.     Right Ear: Hearing, tympanic membrane, ear canal and external ear normal.     Left Ear: Hearing, tympanic membrane, ear canal and external ear normal.     Nose: Nose normal.  Eyes:     General: Lids are normal. Lids are everted, no foreign bodies appreciated.     Conjunctiva/sclera:  Conjunctivae normal.     Pupils: Pupils are equal, round, and reactive to light.  Neck:     Thyroid: No thyroid mass or thyromegaly.     Vascular: No carotid bruit.     Trachea: Trachea normal.  Cardiovascular:     Rate and Rhythm: Normal rate and regular rhythm.     Heart sounds: Normal heart sounds, S1 normal and S2 normal. No murmur heard.    No gallop.  Pulmonary:     Effort: Pulmonary effort is normal. No respiratory distress.     Breath sounds: Normal breath sounds. No wheezing, rhonchi or rales.  Abdominal:     General: Bowel sounds are normal. There is no distension or abdominal bruit.     Palpations: Abdomen is soft. There is no fluid wave or mass.     Tenderness: There is no abdominal tenderness. There is no guarding or rebound.     Hernia: No hernia is present.  Musculoskeletal:     Cervical back: Normal range of motion and neck supple.  Lymphadenopathy:     Cervical: No cervical adenopathy.  Skin:    General: Skin is warm and dry.     Findings: No rash.     Comments:  Dry flaky erythematous rash on right palm, 1 cm diameter  Neurological:     Mental Status: She is alert.     Cranial Nerves: No cranial nerve deficit.     Sensory: No sensory deficit.  Psychiatric:        Mood and Affect: Mood is not anxious or depressed.        Speech: Speech normal.        Behavior: Behavior normal. Behavior is cooperative.        Judgment: Judgment normal.      Results for orders placed or performed in visit on 03/31/22  Magnesium  Result Value Ref Range   Magnesium 2.1 1.5 - 2.5 mg/dL  VITAMIN D 25 Hydroxy (Vit-D Deficiency, Fractures)  Result Value Ref Range   VITD 35.00 30.00 - 100.00 ng/mL  Comprehensive metabolic panel  Result Value Ref Range   Sodium 140 135 - 145 mEq/L   Potassium 4.5 3.5 - 5.1 mEq/L   Chloride 102 96 - 112 mEq/L   CO2 30 19 - 32 mEq/L   Glucose, Bld 86 70 - 99 mg/dL   BUN 14 6 - 23 mg/dL   Creatinine, Ser 0.81 0.40 - 1.20 mg/dL   Total  Bilirubin 0.7 0.2 - 1.2 mg/dL  Alkaline Phosphatase 72 39 - 117 U/L   AST 24 0 - 37 U/L   ALT 20 0 - 35 U/L   Total Protein 6.8 6.0 - 8.3 g/dL   Albumin 4.2 3.5 - 5.2 g/dL   GFR 74.09 >60.00 mL/min   Calcium 9.5 8.4 - 10.5 mg/dL  Lipid panel  Result Value Ref Range   Cholesterol 203 (H) 0 - 200 mg/dL   Triglycerides 57.0 0.0 - 149.0 mg/dL   HDL 72.90 >39.00 mg/dL   VLDL 11.4 0.0 - 40.0 mg/dL   LDL Cholesterol 118 (H) 0 - 99 mg/dL   Total CHOL/HDL Ratio 3    NonHDL 129.61     This visit occurred during the SARS-CoV-2 public health emergency.  Safety protocols were in place, including screening questions prior to the visit, additional usage of staff PPE, and extensive cleaning of exam room while observing appropriate contact time as indicated for disinfecting solutions.   COVID 19 screen:  No recent travel or known exposure to COVID19 The patient denies respiratory symptoms of COVID 19 at this time. The importance of social distancing was discussed today.   Assessment and Plan   The patient's preventative maintenance and recommended screening tests for an annual wellness exam were reviewed in full today. Brought up to date unless services declined.  Counselled on the importance of diet, exercise, and its role in overall health and mortality. The patient's FH and SH was reviewed, including their home life, tobacco status, and drug and alcohol status.   Vaccines: uptodate all. Pap/DVE:  Not indicated TAH Mammo: 07/2019, repeat q2 years DUE Bone Density: osteoporosis in hip 07/2019. She is not interested in  treatment. Repeat due. Colon: 2015 Dr. Ardis Hughs, nml, repeat in 10 years.. Smoking Status: none  HIV screen:   refused. Hep C: negative.   Problem List Items Addressed This Visit     Depression, major, in remission ([place])    Doing well on no medication.      Osteoporosis   Relevant Orders   DG Bone Density   Pure hypercholesterolemia    Work on low cholesterol diet.  Recommended consideration of statin given 9.2%  10 year CVD risk  Can return for cholesterol recheck in 3 months if interested.       Other Visit Diagnoses     Routine general medical examination at a health care facility    -  Primary   Vitamin D deficiency           Eliezer Lofts, MD

## 2022-04-06 NOTE — Assessment & Plan Note (Signed)
Work on low cholesterol diet. Recommended consideration of statin given 9.2%  10 year CVD risk  Can return for cholesterol recheck in 3 months if interested.

## 2022-04-06 NOTE — Patient Instructions (Addendum)
Work on low cholesterol diet. Please call the location of your choice from the menu below to schedule your Mammogram and/or Bone Density appointment.    [facility]                      Phone:  [phone]                               [place], [place] [address]                                                             Services: Traditional and 3D Mammogram, Bone Density

## 2022-04-06 NOTE — Assessment & Plan Note (Signed)
Doing well on no medication.

## 2022-04-07 ENCOUNTER — Encounter: Payer: Medicare HMO | Admitting: Family Medicine

## 2022-04-18 ENCOUNTER — Other Ambulatory Visit: Payer: Self-pay | Admitting: Family Medicine

## 2022-04-18 DIAGNOSIS — Z1231 Encounter for screening mammogram for malignant neoplasm of breast: Secondary | ICD-10-CM

## 2022-05-14 DIAGNOSIS — L237 Allergic contact dermatitis due to plants, except food: Secondary | ICD-10-CM | POA: Diagnosis not present

## 2022-05-14 DIAGNOSIS — Z6828 Body mass index (BMI) 28.0-28.9, adult: Secondary | ICD-10-CM | POA: Diagnosis not present

## 2022-05-15 DIAGNOSIS — Z0100 Encounter for examination of eyes and vision without abnormal findings: Secondary | ICD-10-CM | POA: Diagnosis not present

## 2022-05-16 DIAGNOSIS — H43393 Other vitreous opacities, bilateral: Secondary | ICD-10-CM | POA: Diagnosis not present

## 2022-05-30 ENCOUNTER — Ambulatory Visit (INDEPENDENT_AMBULATORY_CARE_PROVIDER_SITE_OTHER): Payer: Medicare HMO | Admitting: Internal Medicine

## 2022-05-30 ENCOUNTER — Encounter: Payer: Self-pay | Admitting: Internal Medicine

## 2022-05-30 VITALS — BP 112/64 | HR 108 | Temp 97.9°F | Ht 64.0 in | Wt 161.0 lb

## 2022-05-30 DIAGNOSIS — R350 Frequency of micturition: Secondary | ICD-10-CM | POA: Diagnosis not present

## 2022-05-30 DIAGNOSIS — J0110 Acute frontal sinusitis, unspecified: Secondary | ICD-10-CM | POA: Diagnosis not present

## 2022-05-30 LAB — POC URINALSYSI DIPSTICK (AUTOMATED)
Bilirubin, UA: NEGATIVE
Blood, UA: NEGATIVE
Glucose, UA: NEGATIVE
Ketones, UA: POSITIVE
Nitrite, UA: NEGATIVE
Protein, UA: POSITIVE — AB
Spec Grav, UA: 1.015 (ref 1.010–1.025)
Urobilinogen, UA: 0.2 E.U./dL
pH, UA: 7 (ref 5.0–8.0)

## 2022-05-30 MED ORDER — AMOXICILLIN-POT CLAVULANATE 875-125 MG PO TABS: 1.0000 | ORAL_TABLET | Freq: Two times a day (BID) | ORAL | 0 refills | Status: DC

## 2022-05-30 NOTE — Progress Notes (Signed)
Subjective:    Patient ID: Angela Chapman, female    DOB: 1951/01/11, 72 y.o.   MRN: [id number]  HPI Here due to respiratory and urinary symptoms  Has had urinary symptoms for a while---frequency Worsened in past 3 days No dysuria but abnormal smell No blood  Has some pain in right ear Coughing up junk Started 3 days ago Sensitive to pollen--but seems like infection Lots of post nasal drip Fever to 101.4  this morning. Some chills and sweats Some SOB due to nasal congestion and feels "really weak" Frontal headache Some scratchy throat  Taking cough med--tussin. Some help Tylenol this morning Takes the allegra daily--and chlortrimeton  Current Outpatient Medications on File Prior to Visit  Medication Sig Dispense Refill   Elastic Bandages & Supports (V-2 HIGH COMPRESSION HOSE) MISC 15-30 mm HG compression, 1 pair wear daily, knee high 1 each 0   fexofenadine (ALLEGRA) 180 MG tablet Take 180 mg by mouth daily.     Multiple Vitamin (MULTIVITAMIN WITH MINERALS) TABS tablet Take 1 tablet by mouth daily.     Multiple Vitamins-Minerals (HAIR SKIN AND NAILS FORMULA PO) Take 2 tablets by mouth daily.     sodium chloride (OCEAN) 0.65 % nasal spray Place 1 spray into the nose as needed for congestion.     No current facility-administered medications on file prior to visit.    Allergies  Allergen Reactions   Oxycodone-Acetaminophen     REACTION: rash   Sulfonamide Derivatives     REACTION: Swelling    Past Medical History:  Diagnosis Date   Allergy    Blood transfusion without reported diagnosis    Broken ribs    fell and broke several ribs 12-23-12   Depression    no per pt   Fibromyalgia    GERD (gastroesophageal reflux disease)     Past Surgical History:  Procedure Laterality Date   ABDOMINAL HYSTERECTOMY     COLONOSCOPY     WRIST SURGERY     left    Family History  Problem Relation Age of Onset   Arthritis Mother    Cancer Mother        breast   Kidney  disease Mother    Diabetes Mother    Breast cancer Mother 48   Stroke Father    Colon cancer Neg Hx    Esophageal cancer Neg Hx    Rectal cancer Neg Hx    Stomach cancer Neg Hx     Social History   Socioeconomic History   Marital status: Married    Spouse name: Not on file   Number of children: Not on file   Years of education: Not on file   Highest education level: Not on file  Occupational History   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  Tobacco Use   Smoking status: Never   Smokeless tobacco: Never  Vaping Use   Vaping Use: Never used  Substance and Sexual Activity   Alcohol use: No   Drug use: No   Sexual activity: Not on file  Other Topics Concern   Not on file  Social History Narrative   Regular exercise yes 3 time weekly         Social Determinants of Health   Financial Resource Strain: Low Risk  (03/29/2022)   Overall Financial Resource Strain (CARDIA)    Difficulty of Paying Living Expenses: Not hard at all  Food Insecurity: No Food Insecurity (03/29/2022)   Hunger Vital Sign    Worried  About Running Out of Food in the Last Year: Never true    Ran Out of Food in the Last Year: Never true  Transportation Needs: No Transportation Needs (03/29/2022)   PRAPARE - Administrator, Civil Service (Medical): No    Lack of Transportation (Non-Medical): No  Physical Activity: Insufficiently Active (03/29/2022)   Exercise Vital Sign    Days of Exercise per Week: 4 days    Minutes of Exercise per Session: 30 min  Stress: No Stress Concern Present (03/29/2022)   [employer] of Occupational Health - Occupational Stress Questionnaire    Feeling of Stress : Not at all  Social Connections: Socially Integrated (03/28/2021)   Social Connection and Isolation Panel [NHANES]    Frequency of Communication with Friends and Family: More than three times a week    Frequency of Social Gatherings with Friends and Family: More than three times a week    Attends Religious  Services: More than 4 times per year    Active Member of [employer] or Organizations: Yes    Attends Engineer, structural: More than 4 times per year    Marital Status: Married  Catering manager Violence: Not At Risk (03/28/2021)   Humiliation, Afraid, Rape, and Kick questionnaire    Fear of Current or Ex-Partner: No    Emotionally Abused: No    Physically Abused: No    Sexually Abused: No   Review of Systems No N/V Eating okay      Objective:   Physical Exam Constitutional:      Appearance: Normal appearance.  HENT:     Head:     Comments: Mild frontal tenderness    Right Ear: Tympanic membrane and ear canal normal.     Left Ear: Tympanic membrane and ear canal normal.     Nose: No congestion.     Mouth/Throat:     Pharynx: No oropharyngeal exudate or posterior oropharyngeal erythema.  Pulmonary:     Effort: Pulmonary effort is normal.     Breath sounds: Normal breath sounds. No wheezing or rales.  Abdominal:     Palpations: Abdomen is soft.     Tenderness: There is no abdominal tenderness.  Neurological:     Mental Status: She is alert.            Assessment & Plan:

## 2022-05-30 NOTE — Assessment & Plan Note (Signed)
Has significant drainage and fever/illness Discussed adding zyrtec to allergy regimen when pollen is high Will give augmentin 875 bid x 7 days

## 2022-05-30 NOTE — Assessment & Plan Note (Signed)
Urinalysis shows moderate leuks May have bladder infection The augmentin should treat this as well if there is infection

## 2022-06-07 ENCOUNTER — Telehealth: Payer: Self-pay | Admitting: Family Medicine

## 2022-06-07 MED ORDER — DOXYCYCLINE HYCLATE 100 MG PO TABS: 100.0000 mg | ORAL_TABLET | Freq: Two times a day (BID) | ORAL | 0 refills | Status: DC

## 2022-06-07 NOTE — Telephone Encounter (Signed)
Patient was seen on 05/30/2022 for a chest infection,and prescribed amoxicillin.She has already finished her course of antibiotics ,but she said that she is still congested and spitting up grey mucus.She would like to know if she possibly needs another round of antibiotics,or if she needs to be reevaluated?

## 2022-06-07 NOTE — Telephone Encounter (Signed)
Spoke to pt

## 2022-06-08 DIAGNOSIS — M79661 Pain in right lower leg: Secondary | ICD-10-CM | POA: Diagnosis not present

## 2022-06-08 DIAGNOSIS — G2581 Restless legs syndrome: Secondary | ICD-10-CM | POA: Diagnosis not present

## 2022-06-08 DIAGNOSIS — I83893 Varicose veins of bilateral lower extremities with other complications: Secondary | ICD-10-CM | POA: Diagnosis not present

## 2022-06-08 DIAGNOSIS — R252 Cramp and spasm: Secondary | ICD-10-CM | POA: Diagnosis not present

## 2022-07-06 DIAGNOSIS — I83891 Varicose veins of right lower extremities with other complications: Secondary | ICD-10-CM | POA: Diagnosis not present

## 2022-07-06 DIAGNOSIS — I83811 Varicose veins of right lower extremities with pain: Secondary | ICD-10-CM | POA: Diagnosis not present

## 2022-07-20 DIAGNOSIS — I83892 Varicose veins of left lower extremities with other complications: Secondary | ICD-10-CM | POA: Diagnosis not present

## 2022-07-27 DIAGNOSIS — I83891 Varicose veins of right lower extremities with other complications: Secondary | ICD-10-CM | POA: Diagnosis not present

## 2022-07-31 DIAGNOSIS — I83892 Varicose veins of left lower extremities with other complications: Secondary | ICD-10-CM | POA: Diagnosis not present

## 2022-08-28 DIAGNOSIS — I87392 Chronic venous hypertension (idiopathic) with other complications of left lower extremity: Secondary | ICD-10-CM | POA: Diagnosis not present

## 2022-08-28 DIAGNOSIS — I83892 Varicose veins of left lower extremities with other complications: Secondary | ICD-10-CM | POA: Diagnosis not present

## 2022-08-31 DIAGNOSIS — I83891 Varicose veins of right lower extremities with other complications: Secondary | ICD-10-CM | POA: Diagnosis not present

## 2022-08-31 DIAGNOSIS — I87391 Chronic venous hypertension (idiopathic) with other complications of right lower extremity: Secondary | ICD-10-CM | POA: Diagnosis not present

## 2022-09-20 ENCOUNTER — Inpatient Hospital Stay: Admission: RE | Admit: 2022-09-20 | Payer: Medicare HMO | Source: Ambulatory Visit

## 2022-09-20 ENCOUNTER — Ambulatory Visit
Admission: RE | Admit: 2022-09-20 | Discharge: 2022-09-20 | Disposition: A | Discharge status: Home / Self Care | Payer: Medicare HMO | Source: Ambulatory Visit | Attending: Family Medicine | Admitting: Family Medicine

## 2022-09-20 DIAGNOSIS — Z1231 Encounter for screening mammogram for malignant neoplasm of breast: Secondary | ICD-10-CM

## 2022-09-20 DIAGNOSIS — M8588 Other specified disorders of bone density and structure, other site: Secondary | ICD-10-CM | POA: Diagnosis not present

## 2022-09-20 DIAGNOSIS — M810 Age-related osteoporosis without current pathological fracture: Secondary | ICD-10-CM

## 2022-09-20 DIAGNOSIS — E349 Endocrine disorder, unspecified: Secondary | ICD-10-CM | POA: Diagnosis not present

## 2022-09-20 DIAGNOSIS — N958 Other specified menopausal and perimenopausal disorders: Secondary | ICD-10-CM | POA: Diagnosis not present

## 2022-10-03 ENCOUNTER — Telehealth: Payer: Self-pay | Admitting: Family Medicine

## 2022-10-03 NOTE — Telephone Encounter (Signed)
Pt called in requesting a call back stated she would like her results from her bone density test . Please advise [phone]

## 2022-10-03 NOTE — Telephone Encounter (Signed)
Please mail her a copy if she requests it.  Right Forearm Radius  OSTEOPOROSIS T-3.2    AP Spine L1-L2 OSTEOPENIA T -2.2    DualFemur Neck Right OSTEOPOROSIS T -2.7    DualFemur Total Mean OSTEOPENIA T -2.0

## 2022-10-03 NOTE — Telephone Encounter (Signed)
Dexa Scan results and Dr. Daphine Deutscher comments discussed with patient via telephone.  Looks like results were originally sent to MyChart but patient is not on MyChart.   Patient is asking for a specific breakdown of all the areas scanned and what showed osteopenia and what showed osteoporosis.  Please advise.

## 2022-10-04 ENCOUNTER — Encounter: Payer: Self-pay | Admitting: Family Medicine

## 2022-10-04 NOTE — Telephone Encounter (Signed)
Mrs. Staunton notified as instructed by telephone.  Copy of Bone Density Report and Dr. Daphine Deutscher recommendations mailed to patient as requested.

## 2022-10-19 DIAGNOSIS — I87391 Chronic venous hypertension (idiopathic) with other complications of right lower extremity: Secondary | ICD-10-CM | POA: Diagnosis not present

## 2022-10-19 DIAGNOSIS — I83891 Varicose veins of right lower extremities with other complications: Secondary | ICD-10-CM | POA: Diagnosis not present

## 2022-11-09 DIAGNOSIS — I87391 Chronic venous hypertension (idiopathic) with other complications of right lower extremity: Secondary | ICD-10-CM | POA: Diagnosis not present

## 2022-11-09 DIAGNOSIS — I83891 Varicose veins of right lower extremities with other complications: Secondary | ICD-10-CM | POA: Diagnosis not present

## 2022-12-18 DIAGNOSIS — I87322 Chronic venous hypertension (idiopathic) with inflammation of left lower extremity: Secondary | ICD-10-CM | POA: Diagnosis not present

## 2022-12-18 DIAGNOSIS — Z0100 Encounter for examination of eyes and vision without abnormal findings: Secondary | ICD-10-CM | POA: Diagnosis not present

## 2022-12-18 DIAGNOSIS — I87391 Chronic venous hypertension (idiopathic) with other complications of right lower extremity: Secondary | ICD-10-CM | POA: Diagnosis not present

## 2022-12-25 DIAGNOSIS — I87391 Chronic venous hypertension (idiopathic) with other complications of right lower extremity: Secondary | ICD-10-CM | POA: Diagnosis not present

## 2022-12-25 DIAGNOSIS — I83811 Varicose veins of right lower extremities with pain: Secondary | ICD-10-CM | POA: Diagnosis not present

## 2023-01-03 DIAGNOSIS — H43813 Vitreous degeneration, bilateral: Secondary | ICD-10-CM | POA: Diagnosis not present

## 2023-01-04 ENCOUNTER — Telehealth: Payer: Self-pay | Admitting: Family Medicine

## 2023-01-04 NOTE — Telephone Encounter (Signed)
Can we contact her eye doctor to get their most recent record.  I believe they are referring to a carotid Doppler test but would like to verify with that office prior to ordering.

## 2023-01-04 NOTE — Telephone Encounter (Signed)
Spoke with Mrs. Nohr.  She states on Monday night she was sitting in chair getting ready to watch television with her husband when all of a sudden her eyes went crazy.  She started having flashing lights in her eyes.  Her husband called and got her in at [facility] on Wednesday.  She states they did a lot of test and mentioned that the gel in the back of her eye may be separating and they were concerned about possible blood clots or possible TIA that could effect her eyes so they told her to call her PCP and see if Dr. Ermalene Searing will placed on order to have her "neck scanned".  She states since this she has been very sensitive to light.  She was also told at the eye doctor that she has severe dry eye.  She also mentioned her history of having a lot of vascular surgeries in the past year.  She has a follow up scheduled with the eye doctor on 01/23/2023 and thinks they want this scan done prior to that appointment.  Please advise.

## 2023-01-04 NOTE — Telephone Encounter (Signed)
Patient is requesting a call back when able, says she visited [facility] and had some questions regarding the suggested follow up. Can be reached at mobile number

## 2023-01-05 ENCOUNTER — Other Ambulatory Visit: Payer: Self-pay | Admitting: Family Medicine

## 2023-01-05 DIAGNOSIS — H539 Unspecified visual disturbance: Secondary | ICD-10-CM

## 2023-01-05 NOTE — Telephone Encounter (Signed)
Note already scanned ... Addressed on result note.

## 2023-01-05 NOTE — Telephone Encounter (Signed)
Office note requested from [facility].

## 2023-01-05 NOTE — Telephone Encounter (Signed)
Office note placed in Dr. Ermalene Searing office in box to review.

## 2023-01-11 ENCOUNTER — Other Ambulatory Visit: Payer: Medicare HMO

## 2023-01-11 ENCOUNTER — Ambulatory Visit
Admission: RE | Admit: 2023-01-11 | Discharge: 2023-01-11 | Disposition: A | Discharge status: Home / Self Care | Payer: Medicare HMO | Source: Ambulatory Visit | Attending: Family Medicine | Admitting: Family Medicine

## 2023-01-11 DIAGNOSIS — H539 Unspecified visual disturbance: Secondary | ICD-10-CM

## 2023-01-30 DIAGNOSIS — R69 Illness, unspecified: Secondary | ICD-10-CM | POA: Diagnosis not present

## 2023-02-02 DIAGNOSIS — H43813 Vitreous degeneration, bilateral: Secondary | ICD-10-CM | POA: Diagnosis not present

## 2023-02-05 ENCOUNTER — Encounter: Payer: Self-pay | Admitting: Family Medicine

## 2023-03-15 ENCOUNTER — Telehealth: Payer: Self-pay | Admitting: *Deleted

## 2023-03-15 DIAGNOSIS — E7800 Pure hypercholesterolemia, unspecified: Secondary | ICD-10-CM

## 2023-03-15 DIAGNOSIS — R790 Abnormal level of blood mineral: Secondary | ICD-10-CM

## 2023-03-15 DIAGNOSIS — E559 Vitamin D deficiency, unspecified: Secondary | ICD-10-CM

## 2023-03-15 NOTE — Telephone Encounter (Signed)
-----   Message from Alvina Chou sent at 03/15/2023 10:55 AM EST ----- Regarding: lab orders for Fri 2.14.25 Patient is scheduled for CPX labs, please order future labs, Thanks , Camelia Eng

## 2023-03-23 ENCOUNTER — Ambulatory Visit (INDEPENDENT_AMBULATORY_CARE_PROVIDER_SITE_OTHER): Payer: Medicare HMO

## 2023-03-23 ENCOUNTER — Telehealth: Payer: Self-pay | Admitting: Family Medicine

## 2023-03-23 VITALS — Ht 64.0 in | Wt 161.0 lb

## 2023-03-23 DIAGNOSIS — Z0000 Encounter for general adult medical examination without abnormal findings: Secondary | ICD-10-CM

## 2023-03-23 DIAGNOSIS — Z1211 Encounter for screening for malignant neoplasm of colon: Secondary | ICD-10-CM

## 2023-03-23 MED ORDER — OSELTAMIVIR PHOSPHATE 75 MG PO CAPS: 75.0000 mg | ORAL_CAPSULE | Freq: Two times a day (BID) | ORAL | 0 refills | Status: DC

## 2023-03-23 MED ORDER — OSELTAMIVIR PHOSPHATE 75 MG PO CAPS: 75.0000 mg | ORAL_CAPSULE | Freq: Every day | ORAL | 0 refills | Status: DC

## 2023-03-23 NOTE — Telephone Encounter (Signed)
Nancy notified as instructed by telephone. 

## 2023-03-23 NOTE — Addendum Note (Signed)
Addended by: Kerby Nora E on: 03/23/2023 01:17 PM   Modules accepted: Orders

## 2023-03-23 NOTE — Patient Instructions (Signed)
Ms. Angela Chapman , Thank you for taking time to come for your Medicare Wellness Visit. I appreciate your ongoing commitment to your health goals. Please review the following plan we discussed and let me know if I can assist you in the future.   Referrals/Orders/Follow-Ups/Clinician Recommendations:  An order has been placed for a Cologuard for you. They will mail you the kit with instructions on how to obtain the sample and send it back in to be tested. If you do not received your kit, please call our office and let us know.    This is a list of the screening recommended for you and due dates:  Health Maintenance  Topic Date Due   COVID-19 Vaccine (6 - 2024-25 season) 10/22/2022   Colon Cancer Screening  04/17/2023   Flu Shot  05/21/2023*   Mammogram  09/20/2023   Medicare Annual Wellness Visit  03/22/2024   DEXA scan (bone density measurement)  09/19/2024   DTaP/Tdap/Td vaccine (3 - Tdap) 03/30/2030   Pneumonia Vaccine  Completed   Hepatitis C Screening  Completed   Zoster (Shingles) Vaccine  Completed   HPV Vaccine  Aged Out  *Topic was postponed. The date shown is not the original due date.    Advanced directives: (Declined) Advance directive discussed with you today. Even though you declined this today, please call our office should you change your mind, and we can give you the proper paperwork for you to fill out.  Next Medicare Annual Wellness Visit scheduled for next year: Yes 03/25/2024 @ 11:30am televisit

## 2023-03-23 NOTE — Telephone Encounter (Signed)
Spoke with pt's husband and he states that he was seen yesterday virtually by Dr. Ermalene Searing. She advised him that if he tested positive for flu A, that she would send in a preventative medication for the pt to take. He requests for this to be sent in. Pharmacy has been verified.

## 2023-03-23 NOTE — Telephone Encounter (Signed)
Please let Angela Chapman and her husband know I have sent in treatment dosing of Tamiflu for him and prophylactic dosing for her.

## 2023-03-23 NOTE — Progress Notes (Signed)
Subjective:   Angela Chapman is a 73 y.o. female who presents for Medicare Annual (Subsequent) preventive examination.  Visit Complete: Virtual I connected with  [name] on 03/23/23 by a audio enabled telemedicine application and verified that I am speaking with the correct person using two identifiers.  Patient Location: Home  Provider Location: Office/Clinic  I discussed the limitations of evaluation and management by telemedicine. The patient expressed understanding and agreed to proceed.  Vital Signs: Because this visit was a virtual/telehealth visit, some criteria may be missing or patient reported. Any vitals not documented were not able to be obtained and vitals that have been documented are patient reported.  Patient Medicare AWV questionnaire was completed by the patient on (not done); I have confirmed that all information answered by patient is correct and no changes since this date.  Cardiac Risk Factors include: advanced age (>52men, >7 women)    Objective:    Today's Vitals   03/23/23 1149  Weight: 161 lb (73 kg)  Height: 5\' 4"  (1.626 m)   Body mass index is 27.64 kg/m.     03/23/2023   12:04 PM 03/29/2022   12:06 PM 03/28/2021   10:34 AM 03/26/2020   10:27 AM  Advanced Directives  Does Patient Have a Medical Advance Directive? No No No No  Would patient like information on creating a medical advance directive?   Yes (MAU/Ambulatory/Procedural Areas - Information given) No - Patient declined    Current Medications (verified) Outpatient Encounter Medications as of 03/23/2023  Medication Sig   Elastic Bandages & Supports (V-2 HIGH COMPRESSION HOSE) MISC 15-30 mm HG compression, 1 pair wear daily, knee high   fexofenadine (ALLEGRA) 180 MG tablet Take 180 mg by mouth daily.   Multiple Vitamin (MULTIVITAMIN WITH MINERALS) TABS tablet Take 1 tablet by mouth daily.   Multiple Vitamins-Minerals (HAIR SKIN AND NAILS FORMULA PO) Take 2 tablets by mouth daily.    doxycycline (VIBRA-TABS) 100 MG tablet Take 1 tablet (100 mg total) by mouth 2 (two) times daily. (Patient not taking: Reported on 03/23/2023)   sodium chloride (OCEAN) 0.65 % nasal spray Place 1 spray into the nose as needed for congestion. (Patient not taking: Reported on 03/23/2023)   No facility-administered encounter medications on file as of 03/23/2023.   Allergies (verified) Oxycodone-acetaminophen and Sulfonamide derivatives   History: Past Medical History:  Diagnosis Date   Allergy    Blood transfusion without reported diagnosis    Broken ribs    fell and broke several ribs 12-23-12   Depression    no per pt   Fibromyalgia    GERD (gastroesophageal reflux disease)    Past Surgical History:  Procedure Laterality Date   ABDOMINAL HYSTERECTOMY     COLONOSCOPY     WRIST SURGERY     left   Family History  Problem Relation Age of Onset   Arthritis Mother    Cancer Mother        breast   Kidney disease Mother    Diabetes Mother    Breast cancer Mother 37   Stroke Father    Colon cancer Neg Hx    Esophageal cancer Neg Hx    Rectal cancer Neg Hx    Stomach cancer Neg Hx    Social History   Socioeconomic History   Marital status: Married    Spouse name: Not on file   Number of children: Not on file   Years of education: Not on file   Highest education  level: Not on file  Occupational History   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  Tobacco Use   Smoking status: Never   Smokeless tobacco: Never  Vaping Use   Vaping status: Never Used  Substance and Sexual Activity   Alcohol use: No   Drug use: No   Sexual activity: Not on file  Other Topics Concern   Not on file  Social History Narrative   Regular exercise yes 3 time weekly         Social Drivers of Health   Financial Resource Strain: Low Risk  (03/23/2023)   Overall Financial Resource Strain (CARDIA)    Difficulty of Paying Living Expenses: Not hard at all  Food Insecurity: No Food Insecurity (03/23/2023)    Hunger Vital Sign    Worried About Running Out of Food in the Last Year: Never true    Ran Out of Food in the Last Year: Never true  Transportation Needs: No Transportation Needs (03/23/2023)   PRAPARE - Administrator, Civil Service (Medical): No    Lack of Transportation (Non-Medical): No  Physical Activity: Insufficiently Active (03/23/2023)   Exercise Vital Sign    Days of Exercise per Week: 4 days    Minutes of Exercise per Session: 30 min  Stress: No Stress Concern Present (03/23/2023)   [employer] of Occupational Health - Occupational Stress Questionnaire    Feeling of Stress : Not at all  Social Connections: Socially Integrated (03/23/2023)   Social Connection and Isolation Panel [NHANES]    Frequency of Communication with Friends and Family: More than three times a week    Frequency of Social Gatherings with Friends and Family: More than three times a week    Attends Religious Services: More than 4 times per year    Active Member of [employer] or Organizations: Yes    Attends Engineer, structural: More than 4 times per year    Marital Status: Married    Tobacco Counseling Counseling given: Not Answered  Clinical Intake:  Pre-visit preparation completed: Yes  Pain : No/denies pain   BMI - recorded: 27.64 Nutritional Status: BMI 25 -29 Overweight Nutritional Risks: None Diabetes: No  How often do you need to have someone help you when you read instructions, pamphlets, or other written materials from your doctor or pharmacy?: 1 - Never  Interpreter Needed?: No  Comments: lives with husband Information entered by :: B.Makyiah Lie,LPN   Activities of Daily Living    03/23/2023   12:04 PM 03/29/2022   12:08 PM  In your present state of health, do you have any difficulty performing the following activities:  Hearing? 0 0  Vision? 1 0  Difficulty concentrating or making decisions? 1 0  Walking or climbing stairs? 1 0  Dressing or bathing? 0 0   Doing errands, shopping? 0 0  Preparing Food and eating ? N N  Using the Toilet? N N  In the past six months, have you accidently leaked urine? N Y  Do you have problems with loss of bowel control? N N  Managing your Medications? N N  Managing your Finances? N N  Housekeeping or managing your Housekeeping? N N    Patient Care Team: Excell Seltzer, MD as PCP - General Genelle Gather, OD (Optometry)  Indicate any recent Medical Services you may have received from other than [facility] providers in the past year (date may be approximate).     Assessment:   This is a routine  wellness examination for Angela Chapman.  Hearing/Vision screen Hearing Screening - Comments:: Pt says her hearing is good w/hearing aids Vision Screening - Comments:: Pt says his vision is changing(had migraines) [facility] -Dr Seymour Bars   Goals Addressed             This Visit's Progress    Patient Stated   On track    03/26/2020, I will continue to walk 2-3 times a day for about 30 minutes.      Patient Stated   Not on track    Would like to increase steps to 5,000 steps a day. Would like to drink more water and decrease tea intake.      Patient Stated   Not on track    03/29/2022, wants to weigh 155 pounds       Depression Screen    03/23/2023   12:00 PM 04/06/2022   11:49 AM 03/29/2022   12:07 PM 06/30/2021   10:28 AM 04/01/2021   12:11 PM 03/28/2021   10:36 AM 11/19/2020   11:12 AM  PHQ 2/9 Scores  PHQ - 2 Score 0 0 0 0 0 0 0  PHQ- 9 Score  2   0  0    Fall Risk    03/23/2023   11:53 AM 03/29/2022   12:07 PM 03/28/2021   10:35 AM 03/26/2020   10:28 AM 03/27/2019   11:01 AM  Fall Risk   Falls in the past year? 0 0 0 0 0  Number falls in past yr: 0 0 0 0   Injury with Fall? 0 0 0 0   Risk for fall due to : No Fall Risks No Fall Risks No Fall Risks No Fall Risks   Follow up Education provided;Falls prevention discussed Falls prevention discussed;Education provided;Falls evaluation completed Falls prevention  discussed Falls evaluation completed;Falls prevention discussed     MEDICARE RISK AT HOME: Medicare Risk at Home Any stairs in or around the home?: Yes If so, are there any without handrails?: Yes Home free of loose throw rugs in walkways, pet beds, electrical cords, etc?: Yes Adequate lighting in your home to reduce risk of falls?: Yes Life alert?: No Use of a cane, walker or w/c?: No Grab bars in the bathroom?: Yes Shower chair or bench in shower?: Yes Elevated toilet seat or a handicapped toilet?: Yes  TIMED UP AND GO:  Was the test performed?  No    Cognitive Function:    03/26/2020   10:31 AM  MMSE - Mini Mental State Exam  Not completed: Refused        03/23/2023   12:09 PM 03/29/2022   12:11 PM  6CIT Screen  What Year? 0 points 0 points  What month? 0 points 0 points  What time? 0 points 0 points  Count back from 20 0 points 0 points  Months in reverse 0 points 0 points  Repeat phrase 2 points 0 points  Total Score 2 points 0 points    Immunizations Immunization History  Administered Date(s) Administered   Fluad Quad(high Dose 65+) 11/28/2019   Influenza Split 12/14/2010, 11/07/2011   Influenza Whole 11/20/2008, 11/17/2009   Influenza, High Dose Seasonal PF 02/05/2018, 12/18/2018, 12/10/2020, 11/18/2021   Influenza,inj,Quad PF,6+ Mos 12/13/2014, 11/15/2015   Influenza-Unspecified 11/21/2018   Moderna Sars-Covid-2 Vaccination 02/02/2020   PFIZER(Purple Top)SARS-COV-2 Vaccination 06/02/2019, 06/23/2019   Pfizer Covid-19 Vaccine Bivalent Booster 20yrs & up 12/10/2020   Pneumococcal Conjugate-13 03/01/2018   Pneumococcal Polysaccharide-23 03/30/2020   Respiratory  Syncytial Virus Vaccine,Recomb Aduvanted(Arexvy) 12/10/2021   Td 05/11/2008, 03/30/2020   Unspecified SARS-COV-2 Vaccination 11/18/2021   Zoster Recombinant(Shingrix) 07/26/2021, 12/10/2021    TDAP status: Up to date  Flu Vaccine status: Up to date [facility]  Pneumococcal vaccine status: Up to  date  Covid-19 vaccine status: Completed vaccines  Qualifies for Shingles Vaccine? Yes   Zostavax completed Yes   Shingrix Completed?: Yes  Screening Tests Health Maintenance  Topic Date Due   COVID-19 Vaccine (6 - 2024-25 season) 10/22/2022   Colonoscopy  04/17/2023   INFLUENZA VACCINE  05/21/2023 (Originally 09/21/2022)   MAMMOGRAM  09/20/2023   Medicare Annual Wellness (AWV)  03/22/2024   DEXA SCAN  09/19/2024   DTaP/Tdap/Td (3 - Tdap) 03/30/2030   Pneumonia Vaccine 33+ Years old  Completed   Hepatitis C Screening  Completed   Zoster Vaccines- Shingrix  Completed   HPV VACCINES  Aged Out    Health Maintenance  Health Maintenance Due  Topic Date Due   COVID-19 Vaccine (6 - 2024-25 season) 10/22/2022   Colonoscopy  04/17/2023    Colorectal cancer screening: Type of screening: Colonoscopy. Completed 04/16/2013. Repeat every 10 years wants cologard  Mammogram status: Completed yes. Repeat every year Ordered for July  Bone Density status: Completed 09/20/2022. Results reflect: Bone density results: OSTEOPOROSIS. Repeat every 2-3 years.  Lung Cancer Screening: (Low Dose CT Chest recommended if Age 76-80 years, 20 pack-year currently smoking OR have quit w/in 15years.) does not qualify.   Lung Cancer Screening Referral: no  Additional Screening:  Hepatitis C Screening: does not qualify; Completed 01/25/2016  Vision Screening: Recommended annual ophthalmology exams for early detection of glaucoma and other disorders of the eye. Is the patient up to date with their annual eye exam?  Yes  Who is the provider or what is the name of the office in which the patient attends annual eye exams? Dr Seymour Bars If pt is not established with a provider, would they like to be referred to a provider to establish care? No .   Dental Screening: Recommended annual dental exams for proper oral hygiene  Diabetic Foot Exam: n/a  Community Resource Referral / Chronic Care Management: CRR  required this visit?  No   CCM required this visit?  No    Plan:     I have personally reviewed and noted the following in the patient's chart:   Medical and social history Use of alcohol, tobacco or illicit drugs  Current medications and supplements including opioid prescriptions. Patient is not currently taking opioid prescriptions. Functional ability and status Nutritional status Physical activity Advanced directives List of other physicians Hospitalizations, surgeries, and ER visits in previous 12 months Vitals Screenings to include cognitive, depression, and falls Referrals and appointments  In addition, I have reviewed and discussed with patient certain preventive protocols, quality metrics, and best practice recommendations. A written personalized care plan for preventive services as well as general preventive health recommendations were provided to patient.   Sue Lush, LPN   11/04/7827   After Visit Summary: (MyChart) Due to this being a telephonic visit, the after visit summary with patients personalized plan was offered to patient via MyChart   Nurse Notes: Pt says she has more pain in her hands, knees and hips and feels she may have arthritis. She is interested in lab work to access. She also does not want another colonoscopy but willing to do Cologard. Any questions please give pt a call (she says cannot get into mychart).

## 2023-04-02 DIAGNOSIS — Z1211 Encounter for screening for malignant neoplasm of colon: Secondary | ICD-10-CM | POA: Diagnosis not present

## 2023-04-06 ENCOUNTER — Encounter: Payer: Self-pay | Admitting: Family Medicine

## 2023-04-06 ENCOUNTER — Other Ambulatory Visit (INDEPENDENT_AMBULATORY_CARE_PROVIDER_SITE_OTHER): Payer: Medicare HMO

## 2023-04-06 DIAGNOSIS — E559 Vitamin D deficiency, unspecified: Secondary | ICD-10-CM

## 2023-04-06 DIAGNOSIS — R790 Abnormal level of blood mineral: Secondary | ICD-10-CM | POA: Diagnosis not present

## 2023-04-06 DIAGNOSIS — E7800 Pure hypercholesterolemia, unspecified: Secondary | ICD-10-CM | POA: Diagnosis not present

## 2023-04-06 LAB — LIPID PANEL
Cholesterol: 207 mg/dL — ABNORMAL HIGH (ref 0–200)
HDL: 81.9 mg/dL (ref >39.00)
LDL Cholesterol: 110 mg/dL — ABNORMAL HIGH (ref 0–99)
NonHDL: 124.76
Total CHOL/HDL Ratio: 3
Triglycerides: 72 mg/dL (ref 0.0–149.0)
VLDL: 14.4 mg/dL (ref 0.0–40.0)

## 2023-04-06 LAB — COMPREHENSIVE METABOLIC PANEL
ALT: 15 U/L (ref 0–35)
AST: 22 U/L (ref 0–37)
Albumin: 4.3 g/dL (ref 3.5–5.2)
Alkaline Phosphatase: 69 U/L (ref 39–117)
BUN: 15 mg/dL (ref 6–23)
CO2: 32 meq/L (ref 19–32)
Calcium: 9.3 mg/dL (ref 8.4–10.5)
Chloride: 103 meq/L (ref 96–112)
Creatinine, Ser: 0.84 mg/dL (ref 0.40–1.20)
GFR: 70.43 mL/min (ref >60.00)
Glucose, Bld: 85 mg/dL (ref 70–99)
Potassium: 4.5 meq/L (ref 3.5–5.1)
Sodium: 142 meq/L (ref 135–145)
Total Bilirubin: 0.7 mg/dL (ref 0.2–1.2)
Total Protein: 6.6 g/dL (ref 6.0–8.3)

## 2023-04-06 LAB — MAGNESIUM: Magnesium: 2 mg/dL (ref 1.5–2.5)

## 2023-04-06 LAB — VITAMIN D 25 HYDROXY (VIT D DEFICIENCY, FRACTURES): VITD: 34.58 ng/mL (ref 30.00–100.00)

## 2023-04-06 NOTE — Progress Notes (Signed)
No critical labs need to be addressed urgently. We will discuss labs in detail at upcoming office visit.

## 2023-04-08 LAB — COLOGUARD: COLOGUARD: NEGATIVE

## 2023-04-10 ENCOUNTER — Ambulatory Visit (INDEPENDENT_AMBULATORY_CARE_PROVIDER_SITE_OTHER): Payer: Medicare HMO | Admitting: Family Medicine

## 2023-04-10 ENCOUNTER — Encounter: Payer: Self-pay | Admitting: Family Medicine

## 2023-04-10 VITALS — BP 140/70 | HR 71 | Temp 98.1°F | Ht 63.75 in | Wt 165.2 lb

## 2023-04-10 DIAGNOSIS — M810 Age-related osteoporosis without current pathological fracture: Secondary | ICD-10-CM

## 2023-04-10 DIAGNOSIS — Z0000 Encounter for general adult medical examination without abnormal findings: Secondary | ICD-10-CM | POA: Diagnosis not present

## 2023-04-10 DIAGNOSIS — F325 Major depressive disorder, single episode, in full remission: Secondary | ICD-10-CM | POA: Diagnosis not present

## 2023-04-10 MED ORDER — ALENDRONATE SODIUM 70 MG PO TABS: 70.0000 mg | ORAL_TABLET | ORAL | 11 refills | Status: AC

## 2023-04-10 NOTE — Patient Instructions (Signed)
Start fosamax for osteoporosis once weekly.

## 2023-04-10 NOTE — Assessment & Plan Note (Addendum)
Chronic, well controlled in full remission.  Has been taking  extra B12 each day.

## 2023-04-10 NOTE — Progress Notes (Signed)
Patient ID: Angela Chapman, female    DOB: June 01, 1950, 73 y.o.   MRN: [id number]  This visit was conducted in person.  BP (!) 140/70 (BP Location: Right Arm, Patient Position: Sitting, Cuff Size: Normal)   Pulse 71   Temp 98.1 F (36.7 C) (Temporal)   Ht 5' 3.75" (1.619 m)   Wt 165 lb 4 oz (75 kg)   SpO2 99%   BMI 28.59 kg/m    CC: Chief Complaint  Patient presents with   Annual Exam    Part 2    Subjective:   HPI: Angela Chapman is a 73 y.o. female presenting on 04/10/2023 for Annual Exam (Part 2)  The patient presents for complete physical and review of chronic health problems. He/She also has the following acute concerns today:   The patient saw a LPN or RN for medicare wellness visit. 03/23/2023  Prevention and wellness was reviewed in detail. Note reviewed and important notes copied below.  Elevated Cholesterol: At last OV recommended statin.. pt was considering... trying to decrease cheese, eating more yogurt. Lab Results  Component Value Date   CHOL 207 (H) 04/06/2023   HDL 81.90 04/06/2023   LDLCALC 110 (H) 04/06/2023   TRIG 72.0 04/06/2023   CHOLHDL 3 04/06/2023   The 10-year ASCVD risk score (Arnett DK, et al., 2019) is: 10.5%   Values used to calculate the score:     Age: 13 years     Sex: Female     Is Non-Hispanic African American: No     Diabetic: No     Tobacco smoker: No     Systolic Blood Pressure: 140 mmHg     Is BP treated: No     HDL Cholesterol: 81.9 mg/dL     Total Cholesterol: 207 mg/dL Using medications without problems: Muscle aches:  Diet compliance: heart healthy Exercise:  walk 3-4 times a week Other complaints:  BP Readings from Last 3 Encounters:  04/10/23 (!) 140/70  05/30/22 112/64  04/06/22 138/66    Wt Readings from Last 3 Encounters:  04/10/23 165 lb 4 oz (75 kg)  03/23/23 161 lb (73 kg)  05/30/22 161 lb (73 kg)    MDD     04/10/2023   11:22 AM 03/23/2023   12:00 PM 04/06/2022   11:49 AM  Depression screen PHQ 2/9   Decreased Interest 0 0 0  Down, Depressed, Hopeless 0 0 0  PHQ - 2 Score 0 0 0  Altered sleeping 1  1  Tired, decreased energy 0  1  Change in appetite 0  0  Feeling bad or failure about yourself  1  0  Trouble concentrating 0  0  Moving slowly or fidgety/restless 0  0  Suicidal thoughts 0  0  PHQ-9 Score 2  2  Difficult doing work/chores Not difficult at all  Very difficult     Relevant past medical, surgical, family and social history reviewed and updated as indicated. Interim medical history since our last visit reviewed. Allergies and medications reviewed and updated. Outpatient Medications Prior to Visit  Medication Sig Dispense Refill   Calcium Carb-Cholecalciferol (CALCIUM 600 + D PO) Take 1 tablet by mouth in the morning and at bedtime.     Cyanocobalamin (VITAMIN B-12 PO) Take 1 tablet by mouth daily.     Elastic Bandages & Supports (V-2 HIGH COMPRESSION HOSE) MISC 15-30 mm HG compression, 1 pair wear daily, knee high 1 each 0   fexofenadine (ALLEGRA) 180 MG tablet  Take 180 mg by mouth daily.     Multiple Vitamin (MULTIVITAMIN WITH MINERALS) TABS tablet Take 1 tablet by mouth daily.     Multiple Vitamins-Minerals (50+ ADULT EYE HEALTH PO) Take 1 each by mouth daily.     sodium chloride (OCEAN) 0.65 % nasal spray Place 1 spray into the nose as needed for congestion.     doxycycline (VIBRA-TABS) 100 MG tablet Take 1 tablet (100 mg total) by mouth 2 (two) times daily. (Patient not taking: Reported on 03/23/2023) 14 tablet 0   Multiple Vitamins-Minerals (HAIR SKIN AND NAILS FORMULA PO) Take 2 tablets by mouth daily.     oseltamivir (TAMIFLU) 75 MG capsule Take 1 capsule (75 mg total) by mouth daily. 10 capsule 0   No facility-administered medications prior to visit.     Per HPI unless specifically indicated in ROS section below Review of Systems  Constitutional:  Negative for fatigue and fever.  HENT:  Negative for ear pain.   Eyes:  Negative for pain.  Respiratory:   Negative for chest tightness and shortness of breath.   Cardiovascular:  Negative for chest pain, palpitations and leg swelling.  Gastrointestinal:  Negative for abdominal pain.  Genitourinary:  Negative for dysuria.   Objective:  BP (!) 140/70 (BP Location: Right Arm, Patient Position: Sitting, Cuff Size: Normal)   Pulse 71   Temp 98.1 F (36.7 C) (Temporal)   Ht 5' 3.75" (1.619 m)   Wt 165 lb 4 oz (75 kg)   SpO2 99%   BMI 28.59 kg/m   Wt Readings from Last 3 Encounters:  04/10/23 165 lb 4 oz (75 kg)  03/23/23 161 lb (73 kg)  05/30/22 161 lb (73 kg)      Physical Exam Vitals and nursing note reviewed.  Constitutional:      General: She is not in acute distress.    Appearance: Normal appearance. She is well-developed. She is not ill-appearing or toxic-appearing.  HENT:     Head: Normocephalic.     Right Ear: Hearing, tympanic membrane, ear canal and external ear normal.     Left Ear: Hearing, tympanic membrane, ear canal and external ear normal.     Nose: Nose normal.  Eyes:     General: Lids are normal. Lids are everted, no foreign bodies appreciated.     Conjunctiva/sclera: Conjunctivae normal.     Pupils: Pupils are equal, round, and reactive to light.  Neck:     Thyroid: No thyroid mass or thyromegaly.     Vascular: No carotid bruit.     Trachea: Trachea normal.  Cardiovascular:     Rate and Rhythm: Normal rate and regular rhythm.     Heart sounds: Normal heart sounds, S1 normal and S2 normal. No murmur heard.    No gallop.  Pulmonary:     Effort: Pulmonary effort is normal. No respiratory distress.     Breath sounds: Normal breath sounds. No wheezing, rhonchi or rales.  Abdominal:     General: Bowel sounds are normal. There is no distension or abdominal bruit.     Palpations: Abdomen is soft. There is no fluid wave or mass.     Tenderness: There is no abdominal tenderness. There is no guarding or rebound.     Hernia: No hernia is present.  Musculoskeletal:      Cervical back: Normal range of motion and neck supple.  Lymphadenopathy:     Cervical: No cervical adenopathy.  Skin:    General: Skin is warm  and dry.     Findings: No rash.     Comments:  Dry flaky erythematous rash on right palm, 1 cm diameter  Neurological:     Mental Status: She is alert.     Cranial Nerves: No cranial nerve deficit.     Sensory: No sensory deficit.  Psychiatric:        Mood and Affect: Mood is not anxious or depressed.        Speech: Speech normal.        Behavior: Behavior normal. Behavior is cooperative.        Judgment: Judgment normal.       Results for orders placed or performed in visit on 04/06/23  Magnesium   Collection Time: 04/06/23 10:53 AM  Result Value Ref Range   Magnesium 2.0 1.5 - 2.5 mg/dL  VITAMIN D 25 Hydroxy (Vit-D Deficiency, Fractures)   Collection Time: 04/06/23 10:53 AM  Result Value Ref Range   VITD 34.58 30.00 - 100.00 ng/mL  Comprehensive metabolic panel   Collection Time: 04/06/23 10:53 AM  Result Value Ref Range   Sodium 142 135 - 145 mEq/L   Potassium 4.5 3.5 - 5.1 mEq/L   Chloride 103 96 - 112 mEq/L   CO2 32 19 - 32 mEq/L   Glucose, Bld 85 70 - 99 mg/dL   BUN 15 6 - 23 mg/dL   Creatinine, Ser 4.09 0.40 - 1.20 mg/dL   Total Bilirubin 0.7 0.2 - 1.2 mg/dL   Alkaline Phosphatase 69 39 - 117 U/L   AST 22 0 - 37 U/L   ALT 15 0 - 35 U/L   Total Protein 6.6 6.0 - 8.3 g/dL   Albumin 4.3 3.5 - 5.2 g/dL   GFR 81.19 >14.78 mL/min   Calcium 9.3 8.4 - 10.5 mg/dL  Lipid panel   Collection Time: 04/06/23 10:53 AM  Result Value Ref Range   Cholesterol 207 (H) 0 - 200 mg/dL   Triglycerides 29.5 0.0 - 149.0 mg/dL   HDL 62.13 >08.65 mg/dL   VLDL 78.4 0.0 - 69.6 mg/dL   LDL Cholesterol 295 (H) 0 - 99 mg/dL   Total CHOL/HDL Ratio 3    NonHDL 124.76     This visit occurred during the SARS-CoV-2 public health emergency.  Safety protocols were in place, including screening questions prior to the visit, additional usage of staff  PPE, and extensive cleaning of exam room while observing appropriate contact time as indicated for disinfecting solutions.   COVID 19 screen:  No recent travel or known exposure to COVID19 The patient denies respiratory symptoms of COVID 19 at this time. The importance of social distancing was discussed today.   Assessment and Plan   The patient's preventative maintenance and recommended screening tests for an annual wellness exam were reviewed in full today. Brought up to date unless services declined.  Counselled on the importance of diet, exercise, and its role in overall health and mortality. The patient's FH and SH was reviewed, including their home life, tobacco status, and drug and alcohol status.   Vaccines: uptodate all. Pap/DVE:  Not indicated TAH Mammo: 08/2022, repeat q2 years Bone Density: osteoporosis in hip 07/2019. She is not interested in  treatment. Stable  osteoporosis 08/2022 Colon: 2015 Dr. Christella Hartigan, nml, repeat in 10 years but pt has done cologuard instead 2025, repeat in 3 years. Smoking Status: none  HIV screen:   refused. Hep C: negative.   Problem List Items Addressed This Visit  Depression, major, in remission (HCC)    Chronic, well controlled in full remission.  Has been taking  extra B12 each day.      Osteoporosis    Chronic, saw bone density specilaist.. no recs mase other than CVa and Vit.  Start fosamax weekly.. plan 4 years... until 2029.  Recheck DEXA 08/2024      Relevant Medications   Calcium Carb-Cholecalciferol (CALCIUM 600 + D PO)   alendronate (FOSAMAX) 70 MG tablet   Other Visit Diagnoses       Medicare annual wellness visit, subsequent    -  Primary        Kerby Nora, MD

## 2023-04-10 NOTE — Assessment & Plan Note (Addendum)
Chronic, saw bone density specilaist.. no recs mase other than CVa and Vit.  Start fosamax weekly.. plan 4 years... until 2029.  Recheck DEXA 08/2024

## 2023-05-17 DIAGNOSIS — H43393 Other vitreous opacities, bilateral: Secondary | ICD-10-CM | POA: Diagnosis not present

## 2023-05-17 DIAGNOSIS — H2513 Age-related nuclear cataract, bilateral: Secondary | ICD-10-CM | POA: Diagnosis not present

## 2024-03-19 ENCOUNTER — Telehealth: Payer: Self-pay | Admitting: *Deleted

## 2024-03-19 DIAGNOSIS — E559 Vitamin D deficiency, unspecified: Secondary | ICD-10-CM

## 2024-03-19 DIAGNOSIS — R790 Abnormal level of blood mineral: Secondary | ICD-10-CM

## 2024-03-19 DIAGNOSIS — E7800 Pure hypercholesterolemia, unspecified: Secondary | ICD-10-CM

## 2024-03-19 NOTE — Telephone Encounter (Signed)
-----   Message from Harlene Du sent at 03/18/2024 12:25 PM EST ----- Regarding: Lab Fri 04/04/24 Hello,  Patient is coming in for CPE labs. Can we get orders please.   Thanks

## 2024-03-24 ENCOUNTER — Ambulatory Visit

## 2024-03-25 ENCOUNTER — Ambulatory Visit: Payer: Medicare HMO

## 2024-04-04 ENCOUNTER — Other Ambulatory Visit: Payer: Medicare HMO

## 2024-04-11 ENCOUNTER — Encounter: Payer: Medicare HMO | Admitting: Family Medicine

## 2024-04-11 ENCOUNTER — Ambulatory Visit
